# Patient Record
Sex: Male | Born: 2000
Health system: Southern US, Community
[De-identification: ages and names within clinical notes are randomized; demographics above are authoritative.]

## PROBLEM LIST (undated history)

## (undated) DIAGNOSIS — R278 Other lack of coordination: Secondary | ICD-10-CM

## (undated) DIAGNOSIS — F909 Attention-deficit hyperactivity disorder, unspecified type: Secondary | ICD-10-CM

## (undated) DIAGNOSIS — F902 Attention-deficit hyperactivity disorder, combined type: Secondary | ICD-10-CM

## (undated) HISTORY — DX: Other lack of coordination: R27.8

## (undated) HISTORY — DX: Attention-deficit hyperactivity disorder, combined type: F90.2

---
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
## 2018-02-15 ENCOUNTER — Other Ambulatory Visit: Payer: Self-pay

## 2018-02-15 MED ORDER — VYVANSE 70 MG PO CAPS: 70.0000 mg | ORAL_CAPSULE | Freq: Every day | ORAL | 0 refills | Status: DC

## 2018-02-15 NOTE — Telephone Encounter (Signed)
Mom called in for refill for Vyvanse . Last visit 12/06/2017 next visit 03/02/2018. [facility] [place] [place]

## 2018-02-15 NOTE — Telephone Encounter (Signed)
Vyvanse 70 mg daily, # 30 with no refills. RX for above e-scribed and sent to pharmacy on record  [id number] [place] [place] - [address] [place] [address] Phone: [phone] Fax: [phone]    

## 2018-03-02 ENCOUNTER — Institutional Professional Consult (permissible substitution): Payer: BLUE CROSS/BLUE SHIELD | Admitting: Pediatrics

## 2018-03-17 ENCOUNTER — Other Ambulatory Visit: Payer: Self-pay

## 2018-03-17 MED ORDER — VYVANSE 70 MG PO CAPS: 70.0000 mg | ORAL_CAPSULE | Freq: Every day | ORAL | 0 refills | Status: DC

## 2018-03-17 NOTE — Telephone Encounter (Signed)
RX for above e-scribed and sent to pharmacy on record  [id number] [place] [place] - [address] [place] [address] Phone: [phone] Fax: [phone]   

## 2018-03-17 NOTE — Telephone Encounter (Signed)
Mom called in for refill for Vyvanse 70mg . Last visit 12/06/2017 next visit 03/21/2018. [facility] [place]

## 2018-03-21 ENCOUNTER — Encounter: Payer: Self-pay | Admitting: Pediatrics

## 2018-03-21 ENCOUNTER — Ambulatory Visit (INDEPENDENT_AMBULATORY_CARE_PROVIDER_SITE_OTHER): Payer: BLUE CROSS/BLUE SHIELD | Admitting: Pediatrics

## 2018-03-21 VITALS — BP 129/74 | HR 105 | Ht 67.5 in | Wt 165.0 lb

## 2018-03-21 DIAGNOSIS — R278 Other lack of coordination: Secondary | ICD-10-CM | POA: Diagnosis not present

## 2018-03-21 DIAGNOSIS — F902 Attention-deficit hyperactivity disorder, combined type: Secondary | ICD-10-CM | POA: Diagnosis not present

## 2018-03-21 DIAGNOSIS — Z79899 Other long term (current) drug therapy: Secondary | ICD-10-CM

## 2018-03-21 DIAGNOSIS — Z719 Counseling, unspecified: Secondary | ICD-10-CM

## 2018-03-21 DIAGNOSIS — Z7189 Other specified counseling: Secondary | ICD-10-CM

## 2018-03-21 NOTE — Progress Notes (Signed)
[facility] [address] [place] [address] Dept: [phone] Dept Fax: [phone] Loc: [phone] Loc Fax: [phone]  Medical Follow-up  Patient ID: Bruce Barton, male  DOB: 09/25/2001, 17 y.o.  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 Last follow up March 2019 and medicated with Vyvanse 70 mg every morning.  Reports daily medication, even on weekends. 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  Friend told him about the job.   EDUCATION: School: Graduated HS - [employer] in June 2019  Working now for golf course - will take classes at [employer] (Lives in [place], [place]).  Will major in Restaurant manager, fast foodAnimal Care/Tech Was working at [employer] - was fired when his truck broke down.  Some issues with coworkers.   MEDICAL HISTORY: Appetite: WNL  Sleep: Bedtime: Averages 2130 due to works  Awakens: 0430 for work Needs to leave house by [employer]0515  Takes morning medication. Sleep Concerns: Initiation/Maintenance/Other: Asleep easily, sleeps through the night, feels well-rested.  No Sleep concerns. No concerns for toileting. Daily stool, no constipation or diarrhea. Void urine no difficulty. No enuresis.   Participate in daily oral hygiene to include brushing and flossing.  Driving is going well. Wears off around 7 pm.  Individual Medical History/Review of System Changes? No  Allergies: Amoxicillin er  Current Medications:   Vyvanse 70 mg daily Medication Side Effects: None  Family  Medical/Social History Changes?: No  MENTAL HEALTH: Mental Health Issues: Denies sadness, loneliness or depression. No self harm or thoughts of self harm or injury. Denies fears, worries and anxieties. Has good peer relations and is not a bully nor is victimized.  Review of Systems  Neurological: Negative for seizures, speech difficulty and headaches.  Psychiatric/Behavioral: Negative for behavioral problems, decreased concentration, dysphoric mood and sleep disturbance. The patient is not nervous/anxious and is not hyperactive.   All other systems reviewed and are negative.  PHYSICAL EXAM: Vitals:  Today's Vitals   03/21/18 1406  BP: 129/74  Pulse: (!) 105  Weight: 165 lb (74.8 kg)  Height: 5' 7.5" (1.715 m)  , 82 %ile (Z= 0.90) based on CDC (Boys, 2-20 Years) BMI-for-age based on BMI available as of 03/21/2018. Body mass index is 25.46 kg/m.  General Exam: Physical Exam  Constitutional: He is oriented to person, place, and time. Vital signs are normal. He appears well-developed and well-nourished. He is cooperative. No distress.  HENT:  Head: Normocephalic.  Right Ear: Tympanic membrane and ear canal normal.  Left Ear: Tympanic membrane and ear canal normal.  Nose: Nose normal.  Mouth/Throat: Uvula is midline, oropharynx is clear and moist and mucous membranes are normal.  Eyes: Pupils are equal, round, and reactive to light. Conjunctivae, EOM and lids are normal.  Neck: Normal range of motion. Neck supple. No thyromegaly present.  Cardiovascular: Normal rate, regular rhythm and intact distal pulses.  Pulmonary/Chest: Effort normal and breath sounds normal.  Abdominal: Soft. Normal appearance.  Genitourinary:  Genitourinary Comments: Deferred  Musculoskeletal: Normal range of motion.  Neurological: He is alert and oriented to person, place, and time.  He has normal strength and normal reflexes. He displays no tremor. No cranial nerve deficit or sensory deficit. He exhibits  normal muscle tone. He displays a negative Romberg sign. He displays no seizure activity. Coordination and gait normal.  Skin: Skin is warm, dry and intact. Lesion noted.  Scalp cyst Right shoulder new tattoo - eagle head and flag, large, well-healed  Psychiatric: He has a normal mood and affect. His speech is normal and behavior is normal. Judgment and thought content normal. His mood appears not anxious. His affect is not inappropriate. He is not agitated, not aggressive and not hyperactive. Cognition and memory are normal. He does not express impulsivity or inappropriate judgment. He expresses no suicidal ideation. He expresses no suicidal plans. He is attentive.  Vitals reviewed.  Neurological: oriented to place and person  Testing/Developmental Screens: CGI:15/10  Reviewed with patient     DIAGNOSES:    ICD-10-CM   1. Attention deficit hyperactivity disorder (ADHD), combined type F90.2   2. Dysgraphia R27.8   3. Medication management Z79.899   4. Patient counseled Z71.9   5. Counseling and coordination of care Z71.89     RECOMMENDATIONS:  Patient Instructions  DISCUSSION: Patient counseled regarding the following coordination of care items:  Continue medication as directed Vyvanse 70 mg every morning RX for above e-scribed and sent to pharmacy on record  [id number] [place] - [address] [place] [address] Phone: [phone] Fax: [phone]  Counseled medication administration, effects, and possible side effects.  ADHD medications discussed to include different medications and pharmacologic properties of each. Recommendation for specific medication to include dose, administration, expected effects, possible side effects and the risk to benefit ratio of medication management.  Advised importance of:  Good sleep hygiene (8- 10 hours per night) Limited screen time (none on school nights, no more than 2 hours on  weekends) Regular exercise(outside and active play) Healthy eating (drink water, no sodas/sweet tea, limit portions and no seconds).  Counseling at this visit included the review of old records and/or current chart with the patient and family.   Counseling included the following discussion points presented at every visit to improve understanding and treatment compliance.  Recent health history and today's examination Growth and development with anticipatory guidance provided regarding brain growth, executive function maturation and pubertal development  Additionally the patient was counseled to take medication while driving.  Smoking cessation   1.  Almost all adult tobacco users start when they are teens. 2.  While tobacco use rates have been declining among youth over the past 20 years, almost a quarter of teens still use tobacco. 3. Tobacco-using teens are more likely to use other substances, become involved in violent behavior, be sexually active, and are at a higher risk for depression and suicide. Local Resources: [place] Quitline SpiritualAlarm.tn 1-800-QUIT-NOW  [facility] Tobacco Prevention and Control Mary Gillett mgillet@co .[place].us [phone]    Online Resources: American Cancer Society  http://www.cancer.org  Teens Health  EarlyMetal.com.br  Centers for Disease Control and Prevention ParkingAffiliateProgram.tn     Patient verbalized understanding of all topics discussed.  NEXT APPOINTMENT: Return in about 3 months (around 06/21/2018) for Medical Follow up. Medical Decision-making: More than 50% of the appointment was spent counseling and discussing diagnosis and management of symptoms with the patient and family.  Leticia Penna, NP Counseling Time: 40 Total Contact Time: 50

## 2018-03-21 NOTE — Patient Instructions (Addendum)
DISCUSSION: Patient counseled regarding the following coordination of care items:  Continue medication as directed Vyvanse 70 mg every morning RX for above e-scribed and sent to pharmacy on record  [id number] [place] [place] - [address] [place] [address] Phone: [phone] Fax: [phone]  Counseled medication administration, effects, and possible side effects.  ADHD medications discussed to include different medications and pharmacologic properties of each. Recommendation for specific medication to include dose, administration, expected effects, possible side effects and the risk to benefit ratio of medication management.  Advised importance of:  Good sleep hygiene (8- 10 hours per night) Limited screen time (none on school nights, no more than 2 hours on weekends) Regular exercise(outside and active play) Healthy eating (drink water, no sodas/sweet tea, limit portions and no seconds).  Counseling at this visit included the review of old records and/or current chart with the patient and family.   Counseling included the following discussion points presented at every visit to improve understanding and treatment compliance.  Recent health history and today's examination Growth and development with anticipatory guidance provided regarding brain growth, executive function maturation and pubertal development  Additionally the patient was counseled to take medication while driving.  Smoking cessation   1.  Almost all adult tobacco users start when they are teens. 2.  While tobacco use rates have been declining among youth over the past 20 years, almost a quarter of teens still use tobacco. 3. Tobacco-using teens are more likely to use other substances, become involved in violent behavior, be sexually active, and are at a higher risk for depression and suicide. Local Resources: [place] Quitline SpiritualAlarm.tnhttp://www.quitlinenc.com/ 1-800-QUIT-NOW  [place] Tobacco Prevention and Control Mary Gillett mgillet@co .[email or web address] [phone]    Online Resources: American Cancer Society  http://www.cancer.org  Teens Health  EarlyMetal.com.brhttp://kidshealth.org/teen/drug_alcohol/tobacco/smoking.html  Centers for Disease Control and Prevention ReviewTheMovie.co.zahttp://www.cdc.gov/tobacco/basic_information/youth/index.htm

## 2018-04-18 ENCOUNTER — Other Ambulatory Visit: Payer: Self-pay

## 2018-04-18 MED ORDER — VYVANSE 70 MG PO CAPS: 70.0000 mg | ORAL_CAPSULE | Freq: Every day | ORAL | 0 refills | Status: DC

## 2018-04-18 NOTE — Telephone Encounter (Signed)
E-Prescribed Vyvanse 70 mg directly to  [id number] [place] [address] - [address] [place] [address] Phone: [phone] Fax: [phone]   

## 2018-04-18 NOTE — Telephone Encounter (Signed)
Mom called in for refill for Vyvanse 70mg. Last visit6/24/2019 next visit9/18/2019. [facility] [place] [place] 

## 2018-04-26 ENCOUNTER — Other Ambulatory Visit: Payer: Self-pay

## 2018-04-26 MED ORDER — LISDEXAMFETAMINE DIMESYLATE 30 MG PO CAPS: 30.0000 mg | ORAL_CAPSULE | Freq: Every evening | ORAL | 0 refills | Status: DC

## 2018-04-26 NOTE — Telephone Encounter (Signed)
Mom called in for refill for Vyvanse 30mg . Last visit 03/21/2018 next visit 06/15/2018. [facility] [place]

## 2018-04-26 NOTE — Telephone Encounter (Signed)
RX for above e-scribed and sent to pharmacy on record  [id number] [place] [place] - [address] [place] [address] Phone: [phone] Fax: [phone]   

## 2018-05-13 ENCOUNTER — Other Ambulatory Visit: Payer: Self-pay

## 2018-05-13 MED ORDER — VYVANSE 70 MG PO CAPS: 70.0000 mg | ORAL_CAPSULE | Freq: Every day | ORAL | 0 refills | Status: DC

## 2018-05-13 NOTE — Telephone Encounter (Signed)
Vyvanse 70 mg daily, # 30 with no refills. RX for above e-scribed and sent to pharmacy on record  [id number] [place] - [address] [place] [address] Phone: [phone] Fax: [phone]

## 2018-05-13 NOTE — Telephone Encounter (Signed)
Mom called in for refill for Vyvanse 70mg . Last visit6/24/2019 next visit9/18/2019. [facility] [place]

## 2018-06-15 ENCOUNTER — Encounter: Payer: Self-pay | Admitting: Pediatrics

## 2018-06-15 ENCOUNTER — Ambulatory Visit (INDEPENDENT_AMBULATORY_CARE_PROVIDER_SITE_OTHER): Payer: BC Managed Care – PPO | Admitting: Pediatrics

## 2018-06-15 VITALS — BP 117/85 | HR 103 | Ht 67.75 in | Wt 165.0 lb

## 2018-06-15 DIAGNOSIS — R278 Other lack of coordination: Secondary | ICD-10-CM

## 2018-06-15 DIAGNOSIS — F902 Attention-deficit hyperactivity disorder, combined type: Secondary | ICD-10-CM | POA: Diagnosis not present

## 2018-06-15 DIAGNOSIS — Z79899 Other long term (current) drug therapy: Secondary | ICD-10-CM | POA: Diagnosis not present

## 2018-06-15 DIAGNOSIS — Z719 Counseling, unspecified: Secondary | ICD-10-CM | POA: Diagnosis not present

## 2018-06-15 DIAGNOSIS — Z7189 Other specified counseling: Secondary | ICD-10-CM

## 2018-06-15 MED ORDER — VYVANSE 70 MG PO CAPS: 70.0000 mg | ORAL_CAPSULE | Freq: Every day | ORAL | 0 refills | Status: DC

## 2018-06-15 NOTE — Patient Instructions (Addendum)
DISCUSSION: Patient and family counseled regarding the following coordination of care items:  Continue medication as directed Vyvanse 70 mg every morning RX for above e-scribed and sent to pharmacy on record  [id number] [place] - [address] [place] [address] Phone: [phone] Fax: [phone]  Counseled medication administration, effects, and possible side effects.  ADHD medications discussed to include different medications and pharmacologic properties of each. Recommendation for specific medication to include dose, administration, expected effects, possible side effects and the risk to benefit ratio of medication management.  Advised importance of:  Good sleep hygiene (8- 10 hours per night) Limited screen time (none on school nights, no more than 2 hours on weekends) Regular exercise(outside and active play) Healthy eating (drink water, no sodas/sweet tea, limit portions and no seconds).  Counseling at this visit included the review of old records and/or current chart with the patient and family.   Counseling included the following discussion points presented at every visit to improve understanding and treatment compliance.  Recent health history and today's examination Growth and development with anticipatory guidance provided regarding brain growth, executive function maturation and pubertal development School progress and continued advocay for appropriate accommodations to include maintain Structure, routine, organization, reward, motivation and consequences.  Additionally the patient was counseled to take medication while driving.  Smoking cessation   1.  Almost all adult tobacco users start when they are teens. 2.  While tobacco use rates have been declining among youth over the past 20 years, almost a quarter of teens still use tobacco. 3. Tobacco-using teens are more likely to use other substances, become involved in violent  behavior, be sexually active, and are at a higher risk for depression and suicide. Local Resources: [place] Quitline SpiritualAlarm.tnhttp://www.quitlinenc.com/ 1-800-QUIT-NOW  [facility] [place] Tobacco Prevention and Control Mary Gillett mgillet@co .[place].us [phone]   Online Resources: American Cancer Society  http://www.cancer.org  Teens Health  EarlyMetal.com.brhttp://kidshealth.org/teen/drug_alcohol/tobacco/smoking.html  Centers for Disease Control and Prevention ReviewTheMovie.co.zahttp://www.cdc.gov/tobacco/basic_information/youth/index.htm
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
## 2018-07-14 ENCOUNTER — Other Ambulatory Visit: Payer: Self-pay

## 2018-07-14 MED ORDER — VYVANSE 70 MG PO CAPS: 70.0000 mg | ORAL_CAPSULE | Freq: Every day | ORAL | 0 refills | Status: DC

## 2018-07-14 NOTE — Telephone Encounter (Signed)
Mom called in for refill for Vyvanse70mg. Last visit9/18/2019 next visit12/12/2017. [facility] [place] [place] 

## 2018-07-14 NOTE — Telephone Encounter (Signed)
RX for above e-scribed and sent to pharmacy on record  [id number] [place] [place] - [address] [place] [address] Phone: [phone] Fax: [phone]   

## 2018-08-16 ENCOUNTER — Other Ambulatory Visit: Payer: Self-pay

## 2018-08-16 MED ORDER — VYVANSE 70 MG PO CAPS: 70.0000 mg | ORAL_CAPSULE | Freq: Every day | ORAL | 0 refills | Status: DC

## 2018-08-16 NOTE — Telephone Encounter (Signed)
RX for above e-scribed and sent to pharmacy on record  [id number] [place] - [address] [place] [address] Phone: [phone] Fax: [phone]   

## 2018-08-16 NOTE — Telephone Encounter (Signed)
Mom called in for refill for Vyvanse70mg . Last visit9/18/2019 next visit12/12/2017. [facility] [place]

## 2018-08-31 ENCOUNTER — Encounter: Payer: Self-pay | Admitting: Pediatrics

## 2018-08-31 ENCOUNTER — Ambulatory Visit (INDEPENDENT_AMBULATORY_CARE_PROVIDER_SITE_OTHER): Payer: BC Managed Care – PPO | Admitting: Pediatrics

## 2018-08-31 VITALS — BP 108/80 | HR 108 | Ht 67.5 in | Wt 172.0 lb

## 2018-08-31 DIAGNOSIS — F902 Attention-deficit hyperactivity disorder, combined type: Secondary | ICD-10-CM

## 2018-08-31 DIAGNOSIS — Z79899 Other long term (current) drug therapy: Secondary | ICD-10-CM

## 2018-08-31 DIAGNOSIS — Z719 Counseling, unspecified: Secondary | ICD-10-CM | POA: Diagnosis not present

## 2018-08-31 DIAGNOSIS — R278 Other lack of coordination: Secondary | ICD-10-CM | POA: Diagnosis not present

## 2018-08-31 DIAGNOSIS — Z7189 Other specified counseling: Secondary | ICD-10-CM

## 2018-08-31 MED ORDER — VYVANSE 70 MG PO CAPS: 70.0000 mg | ORAL_CAPSULE | Freq: Every day | ORAL | 0 refills | Status: DC

## 2018-08-31 NOTE — Patient Instructions (Addendum)
DISCUSSION: Patient and family counseled regarding the following coordination of care items:  Continue medication as directed Vyvanse 70 mg every morning RX for above e-scribed and sent to pharmacy on record  [id number] [place] - [address] [place] [address] Phone: [phone] Fax: [phone]  Counseled medication administration, effects, and possible side effects.  ADHD medications discussed to include different medications and pharmacologic properties of each. Recommendation for specific medication to include dose, administration, expected effects, possible side effects and the risk to benefit ratio of medication management.  Advised importance of:  Good sleep hygiene (8- 10 hours per night) Limited screen time (none on school nights, no more than 2 hours on weekends) Regular exercise(outside and active play) Healthy eating (drink water, no sodas/sweet tea, limit portions and no seconds).  Counseling at this visit included the review of old records and/or current chart with the patient and family.   Counseling included the following discussion points presented at every visit to improve understanding and treatment compliance.  Recent health history and today's examination Growth and development with anticipatory guidance provided regarding brain growth, executive function maturation and pubertal development School progress and continued advocay for appropriate accommodations to include maintain Structure, routine, organization, reward, motivation and consequences.  Follow up with dermatology for scalp cyst.

## 2018-08-31 NOTE — Progress Notes (Signed)
[facility] [address] [place] [address] Dept: [phone] Dept Fax: [phone] Loc: [phone] Loc Fax: [phone]  Medical Follow-up  Patient ID: Bruce Barton, male  DOB: 08/13/2001, 17 y.o.  MRN: [id number]  Date of Evaluation: 08/31/18  PCP: Bernadette HoitPuzio, [name], MD  Accompanied by: Self Patient Lives with: mother and father  TJ is home, working [employer]  HISTORY/CURRENT STATUS:  Human resources officerChief Complaint - Polite and cooperative and present for medical follow up for medication management of ADHD, dysgraphia and learning differences. Last follow up 06/15/18.  Currently prescribed Vyvanse 70 mg every morning and reports good daily compliance.   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 Has had job interviews for [employer] (one today and has a few end of week).    No classes yet, may register in January, depends on jobs.  MEDICAL HISTORY: Appetite: WNL  Sleep: Bedtime: 2230 varibable Awakens: 0800 Sleep Concerns: Initiation/Maintenance/Other: Asleep easily, sleeps through the night, feels well-rested.  No Sleep concerns.  No concerns for toileting. Daily stool, no constipation or diarrhea. Void urine no difficulty. No enuresis.   Participate in daily oral hygiene to include brushing and flossing.  Individual Medical History/Review of System Changes? No  Allergies: Amoxicillin er  Current Medications:  Vyvanse 70 mg every morning Medication Side Effects: None  Family Medical/Social History Changes?: No  MENTAL HEALTH: Mental Health Issues:  Denies sadness, loneliness or depression. No self harm or thoughts of self harm or injury. Denies fears, worries and anxieties. Has good peer relations and is not a bully nor is victimized.  Review of Systems  Neurological: Negative for seizures,  speech difficulty and headaches.  Psychiatric/Behavioral: Negative for behavioral problems, decreased concentration, dysphoric mood and sleep disturbance. The patient is not nervous/anxious and is not hyperactive.   All other systems reviewed and are negative.  PHYSICAL EXAM: Vitals:  Today's Vitals   08/31/18 1447  Weight: 172 lb (78 kg)  Height: 5' 7.5" (1.715 m)  , 86 %ile (Z= 1.07) based on CDC (Boys, 2-20 Years) BMI-for-age based on BMI available as of 08/31/2018. Body mass index is 26.54 kg/m.  General Exam: Physical Exam  Constitutional: He is oriented to person, place, and time. Vital signs are normal. He appears well-developed and well-nourished. He is cooperative. No distress.  HENT:  Head: Normocephalic.  Right Ear: Tympanic membrane and ear canal normal.  Left Ear: Tympanic membrane and ear canal normal.  Nose: Nose normal.  Mouth/Throat: Uvula is midline, oropharynx is clear and moist and mucous membranes are normal.  Eyes: Pupils are equal, round, and reactive to light. Conjunctivae, EOM and lids are normal.  Neck: Normal range of motion. Neck supple. No thyromegaly present.  Cardiovascular: Normal rate, regular rhythm and intact distal pulses.  Pulmonary/Chest: Effort normal and breath sounds normal.  Abdominal: Soft. Normal appearance.  Genitourinary:  Genitourinary Comments: Deferred  Musculoskeletal: Normal range of motion.  Neurological: He is alert and oriented to person, place, and time. He has normal strength and normal reflexes. He displays no tremor. No cranial nerve deficit or sensory deficit. He exhibits normal muscle tone. He displays a negative Romberg sign. He displays no seizure activity. Coordination and gait normal.  Skin: Skin is warm, dry and intact. Lesion noted.  Scalp cyst Right shoulder new tattoo - eagle head and flag, large, well-healed  Psychiatric: He has a  normal mood and affect. His speech is normal and behavior is normal. Judgment and  thought content normal. His mood appears not anxious. His affect is not inappropriate. He is not agitated, not aggressive and not hyperactive. Cognition and memory are normal. He does not express impulsivity or inappropriate judgment. He expresses no suicidal ideation. He expresses no suicidal plans. He is attentive.  Vitals reviewed.  Neurological: oriented to place and person Testing/Developmental Screens: CGI:12/15     DIAGNOSES:    ICD-10-CM   1. Attention deficit hyperactivity disorder (ADHD), combined type F90.2   2. Dysgraphia R27.8   3. Medication management Z79.899   4. Patient counseled Z71.9   5. Parenting dynamics counseling Z71.89   6. Counseling and coordination of care Z71.89     RECOMMENDATIONS:  Patient Instructions  DISCUSSION: Patient and family counseled regarding the following coordination of care items:  Continue medication as directed Vyvanse 70 mg every morning RX for above e-scribed and sent to pharmacy on record  [id number] [place] - [address] [place] [address] Phone: [phone] Fax: [phone]      Counseled medication administration, effects, and possible side effects.  ADHD medications discussed to include different medications and pharmacologic properties of each. Recommendation for specific medication to include dose, administration, expected effects, possible side effects and the risk to benefit ratio of medication management.  Advised importance of:  Good sleep hygiene (8- 10 hours per night) Limited screen time (none on school nights, no more than 2 hours on weekends) Regular exercise(outside and active play) Healthy eating (drink water, no sodas/sweet tea, limit portions and no seconds).  Counseling at this visit included the review of old records and/or current chart with the patient and family.   Counseling included the following discussion points presented at every visit to improve  understanding and treatment compliance.  Recent health history and today's examination Growth and development with anticipatory guidance provided regarding brain growth, executive function maturation and pubertal development School progress and continued advocay for appropriate accommodations to include maintain Structure, routine, organization, reward, motivation and consequences.  Patient verbalized understanding of all topics discussed.   NEXT APPOINTMENT: Return in about 3 months (around 11/30/2018) for Medical Follow up.   Medical Decision-making: More than 50% of the appointment was spent counseling and discussing diagnosis and management of symptoms with the patient and family.  Leticia Penna, NP Counseling Time: 40 Total Contact Time: 50

## 2018-09-14 ENCOUNTER — Other Ambulatory Visit: Payer: Self-pay

## 2018-09-14 MED ORDER — VYVANSE 70 MG PO CAPS: 70.0000 mg | ORAL_CAPSULE | Freq: Every day | ORAL | 0 refills | Status: DC

## 2018-09-14 NOTE — Telephone Encounter (Signed)
Rx for Vyvanse 70mg  mg sent to pt pharmacy:  [facility] [id number] [place] - [address] [place] [address] Phone: [phone] Fax: [phone]

## 2018-09-14 NOTE — Telephone Encounter (Signed)
Mom called in for refill for Vyvanse70mg . Last visit12/12/2017 next visit3/11/2018. [facility] [place]

## 2018-10-20 ENCOUNTER — Other Ambulatory Visit: Payer: Self-pay

## 2018-10-20 MED ORDER — VYVANSE 70 MG PO CAPS: 70.0000 mg | ORAL_CAPSULE | Freq: Every day | ORAL | 0 refills | Status: DC

## 2018-10-20 MED ORDER — LISDEXAMFETAMINE DIMESYLATE 30 MG PO CAPS: 30.0000 mg | ORAL_CAPSULE | Freq: Every day | ORAL | 0 refills | Status: DC

## 2018-10-20 NOTE — Telephone Encounter (Signed)
RX for above e-scribed and sent to pharmacy on record  [id number] [place] [place] - [address] [place] [address] Phone: [phone] Fax: [phone]   

## 2018-10-20 NOTE — Telephone Encounter (Signed)
Mom called in for refill for Vyvanse70mg and 30mg. Last visit12/12/2017 next visit3/11/2018. [facility] [place] [place] 

## 2018-11-24 ENCOUNTER — Other Ambulatory Visit: Payer: Self-pay

## 2018-11-24 MED ORDER — VYVANSE 70 MG PO CAPS: 70.0000 mg | ORAL_CAPSULE | Freq: Every day | ORAL | 0 refills | Status: DC

## 2018-11-24 MED ORDER — LISDEXAMFETAMINE DIMESYLATE 30 MG PO CAPS: 30.0000 mg | ORAL_CAPSULE | Freq: Every day | ORAL | 0 refills | Status: DC

## 2018-11-24 NOTE — Telephone Encounter (Signed)
RX for above e-scribed and sent to pharmacy on record  [id number] [place] [place] - [address] [place] [address] Phone: [phone] Fax: [phone]   

## 2018-11-24 NOTE — Telephone Encounter (Signed)
Mom called in for refill for Vyvanse70mg  and 30mg . Last visit12/12/2017 next visit3/11/2018. [facility] [place] [place]

## 2018-11-29 ENCOUNTER — Ambulatory Visit (INDEPENDENT_AMBULATORY_CARE_PROVIDER_SITE_OTHER): Payer: BC Managed Care – PPO | Admitting: Pediatrics

## 2018-11-29 ENCOUNTER — Encounter: Payer: Self-pay | Admitting: Pediatrics

## 2018-11-29 VITALS — BP 120/85 | HR 108 | Ht 68.0 in | Wt 179.0 lb

## 2018-11-29 DIAGNOSIS — Z7189 Other specified counseling: Secondary | ICD-10-CM

## 2018-11-29 DIAGNOSIS — Z79899 Other long term (current) drug therapy: Secondary | ICD-10-CM | POA: Diagnosis not present

## 2018-11-29 DIAGNOSIS — F902 Attention-deficit hyperactivity disorder, combined type: Secondary | ICD-10-CM

## 2018-11-29 DIAGNOSIS — R278 Other lack of coordination: Secondary | ICD-10-CM | POA: Diagnosis not present

## 2018-11-29 DIAGNOSIS — Z719 Counseling, unspecified: Secondary | ICD-10-CM

## 2018-11-29 NOTE — Progress Notes (Signed)
Patient ID: Bruce Barton, male   DOB: [birth year], 18 y.o.   MRN: [id number]  Medication Check  Patient ID: Bruce Barton  DOB: [birth year]  MRN: [id number]  DATE:11/29/18 Bruce Hoit, MD  Accompanied by: Self Patient Lives with: mother and father  Brother lives with GF out of home  HISTORY/CURRENT STATUS: Chief Complaint - Polite and cooperative and present for medical follow up for medication management of ADHD, dysgraphia and learning differences.  Last follow up 08/31/2018 and currently medicated with Vyvanse 70 mg every morning.  Has RX for Vyvanse 30 mg and uses for break or back up when out of 70 mg.  Good daily compliance.  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  Not doing his best.  All work is on-line, but class is live. Not able to Strategizing, needs to get a C in classes. Reports that he has spoken to professors, has not signed up for Disability services.   May be switching to college for history, and associates and then four year school wants Glass blower/designer.  Has classes Monday - Wednesday Am Thursday Am  MEDICAL HISTORY: Appetite: WNL   Sleep: Bedtime: 2200  Awakens: 0700 and variable for work/school   Concerns: Initiation/Maintenance/Other: Asleep easily, sleeps through the night, feels well-rested.  No Sleep concerns. No concerns for toileting. Daily stool, no constipation or diarrhea. Void urine no difficulty. No enuresis.   Participate in daily oral hygiene to include brushing and flossing.  Individual Medical History/ Review of Systems: Changes? :No  Family Medical/ Social History: Changes? No  Current Medications:  Vyvanse 70 mg every morning Medication Side Effects:  None  MENTAL HEALTH: Mental Health Issues:  Denies sadness, loneliness or depression. No self harm or thoughts of self harm or injury. Denies fears, worries and anxieties. Has good peer relations and is not a bully nor is victimized.  Review of Systems  Neurological: Negative for seizures, speech difficulty and headaches.  Psychiatric/Behavioral: Negative for behavioral problems, decreased concentration, dysphoric mood and sleep disturbance. The patient is not nervous/anxious and is not hyperactive.   All other systems reviewed and are negative.  PHYSICAL EXAM; Vitals:   11/29/18 1413  BP: 120/85  Pulse: (!) 108  Weight: 179 lb (81.2 kg)  Height: 5\' 8"  (1.727 m)   Body mass index is 27.22 kg/m.  General Physical Exam: Unchanged from previous exam, date:11/29/2018   Testing/Developmental Screens: CGI/ASRS = 13/12 Reviewed with patient     DIAGNOSES:    ICD-10-CM   1. Attention deficit hyperactivity disorder (ADHD), combined type F90.2   2. Dysgraphia R27.8   3. Medication management Z79.899   4. Patient counseled Z71.9   5. Counseling and coordination of care Z71.89     RECOMMENDATIONS:  Patient Instructions  DISCUSSION: Counseled regarding the following coordination of care items:  Continue medication as directed Vyvanse 70 mg every morning Vyvanse 30 mg for weekend and break as needed No Rx today last sent 11/24/2018  Counseled medication administration, effects, and possible side effects.  ADHD medications discussed to include different medications and pharmacologic properties of each. Recommendation for specific medication to include dose, administration, expected effects, possible side effects and the risk to benefit ratio of medication management.  Advised importance of:  Good sleep hygiene (8- 10 hours per night) Limited screen time (none on school nights, no more than 2 hours on weekends) Regular exercise(outside and active play) Healthy eating (drink  water, no sodas/sweet tea)  Counseling at this visit included the review of old records and/or current chart.   Counseling included the following discussion points presented at every visit to improve understanding and treatment compliance.  Recent health history and today's examination Growth and development with anticipatory guidance provided regarding brain growth, executive function maturation and pre or pubertal development. School progress and continued advocay for appropriate accommodations to include maintain Structure, routine, organization, reward, motivation and consequences.  Additionally the patient was counseled to take medication while driving.  Counseled to access disability services at college. https://www.[facility].org/  Emailed to patient.   Patient verbalized understanding of all topics discussed.  NEXT APPOINTMENT:  Return in about 3 months (around 03/01/2019) for Medical Follow up.  Medical Decision-making: More than 50% of the appointment was spent counseling and discussing diagnosis and management of symptoms with the patient and family.  Counseling Time: 25 minutes Total Contact Time: 30 minutes

## 2018-11-29 NOTE — Patient Instructions (Addendum)
DISCUSSION: Counseled regarding the following coordination of care items:  Continue medication as directed Vyvanse 70 mg every morning Vyvanse 30 mg for weekend and break as needed No Rx today last sent 11/24/2018  Counseled medication administration, effects, and possible side effects.  ADHD medications discussed to include different medications and pharmacologic properties of each. Recommendation for specific medication to include dose, administration, expected effects, possible side effects and the risk to benefit ratio of medication management.  Advised importance of:  Good sleep hygiene (8- 10 hours per night) Limited screen time (none on school nights, no more than 2 hours on weekends) Regular exercise(outside and active play) Healthy eating (drink water, no sodas/sweet tea)  Counseling at this visit included the review of old records and/or current chart.   Counseling included the following discussion points presented at every visit to improve understanding and treatment compliance.  Recent health history and today's examination Growth and development with anticipatory guidance provided regarding brain growth, executive function maturation and pre or pubertal development. School progress and continued advocay for appropriate accommodations to include maintain Structure, routine, organization, reward, motivation and consequences.  Additionally the patient was counseled to take medication while driving.  Counseled to access disability services at college. https://www.[facility].org/  Emailed to patient.

## 2018-12-22 ENCOUNTER — Other Ambulatory Visit: Payer: Self-pay

## 2018-12-22 MED ORDER — VYVANSE 70 MG PO CAPS: 70.0000 mg | ORAL_CAPSULE | Freq: Every day | ORAL | 0 refills | Status: DC

## 2018-12-22 MED ORDER — LISDEXAMFETAMINE DIMESYLATE 30 MG PO CAPS: 30.0000 mg | ORAL_CAPSULE | Freq: Every day | ORAL | 0 refills | Status: DC

## 2018-12-22 NOTE — Telephone Encounter (Signed)
E-Prescribed Vyvanse 70 mg and 30 mg directly to  [id number], [place] - [address] [place] [address] Phone: [phone] Fax: [phone]

## 2018-12-22 NOTE — Telephone Encounter (Signed)
Mom called in for refill for Vyvanse70mg and 30mg. Last visit12/12/2017 next visit3/11/2018. [facility] [place] [place] 

## 2019-01-06 IMAGING — US US SOFT TISSUE HEAD/NECK
1 series · 14 of 15 positions shown · non-contrast
Comparison: None available

CLINICAL DATA: Left parietal occipital palpable scalp area, mildly
tender

EXAM:
ULTRASOUND OF HEAD/NECK SOFT TISSUES
TECHNIQUE: Ultrasound examination of the head and neck soft tissues was
performed in the area of clinical concern.

[Series 1: us soft tissue head/neck · 0.03mm/px · 14 of 15 slices shown]
[im 1/15]
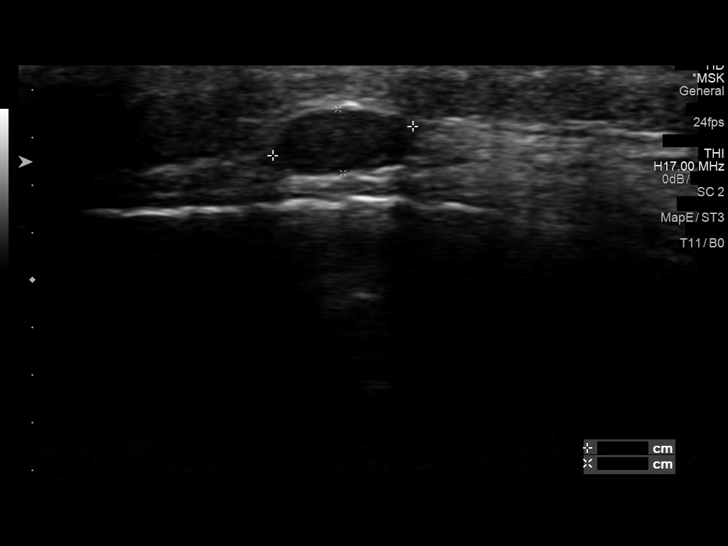
[im 2/15]
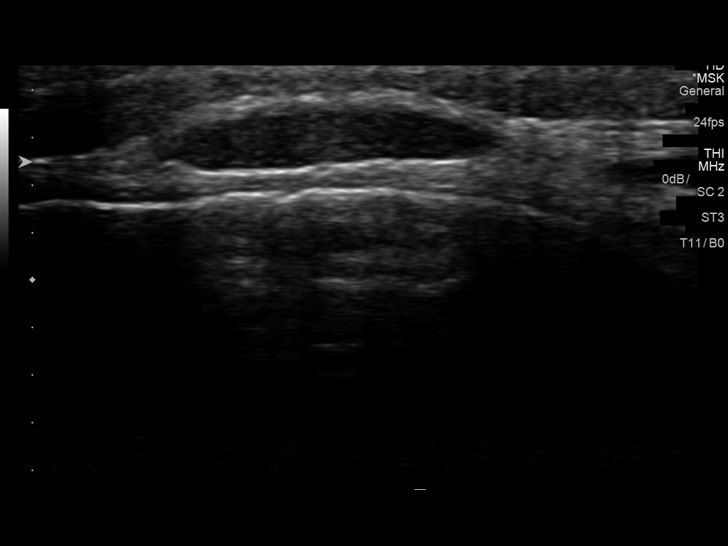
[im 3/15]
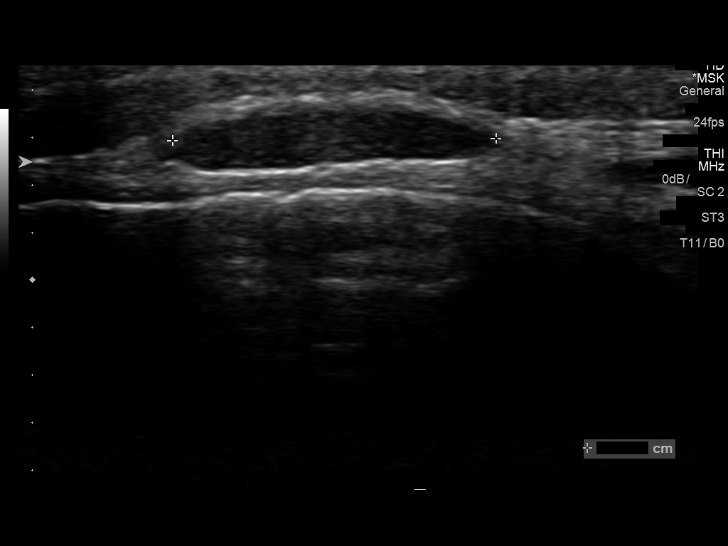
[im 4/15]
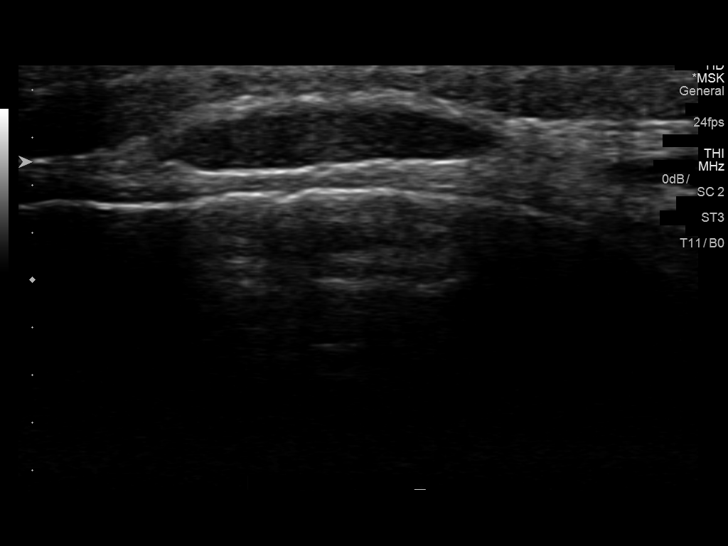
[im 5/15]
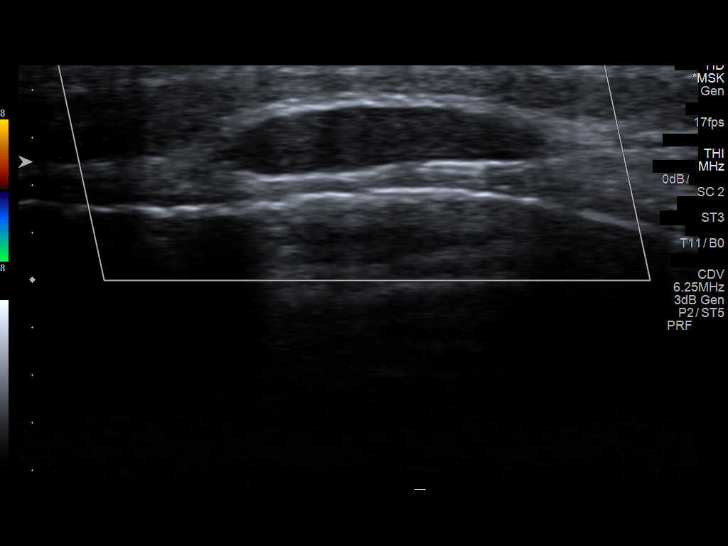
[im 6/15]
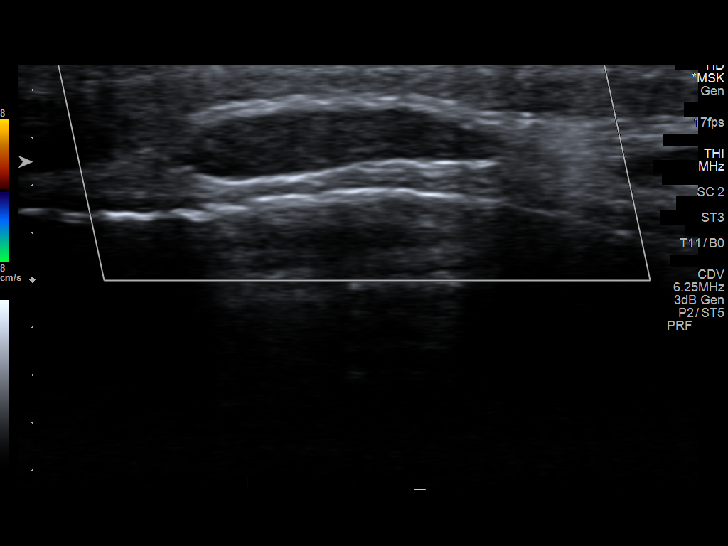
[im 7/15]
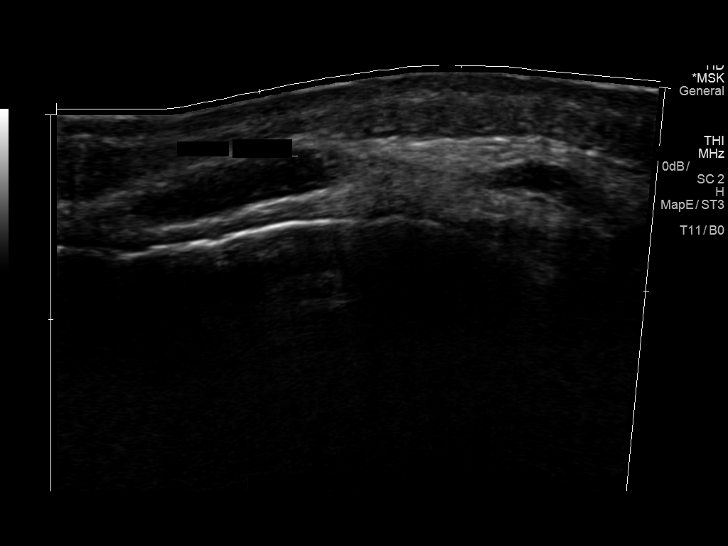
[im 9/15]
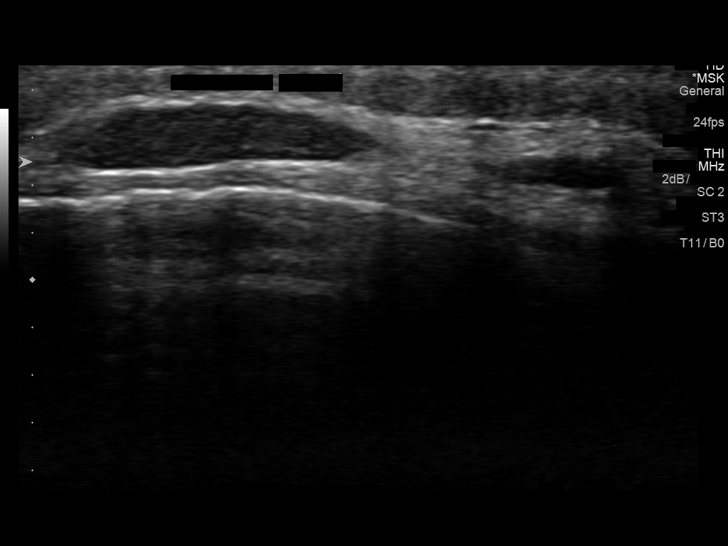
[im 10/15]
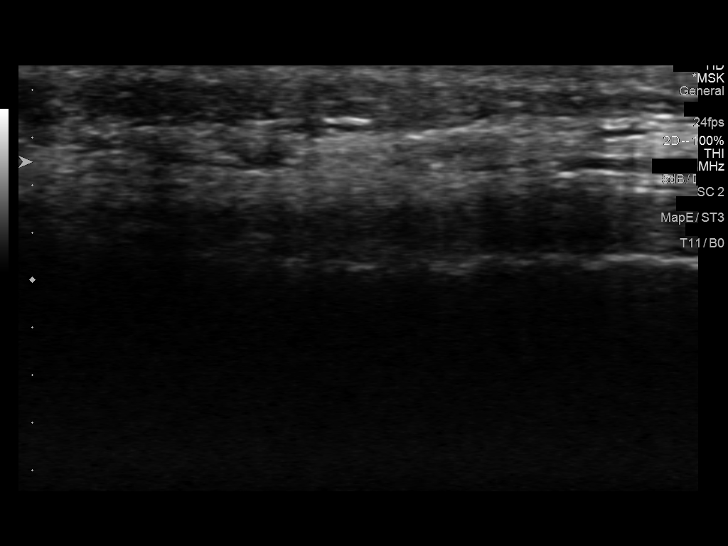
[im 11/15]
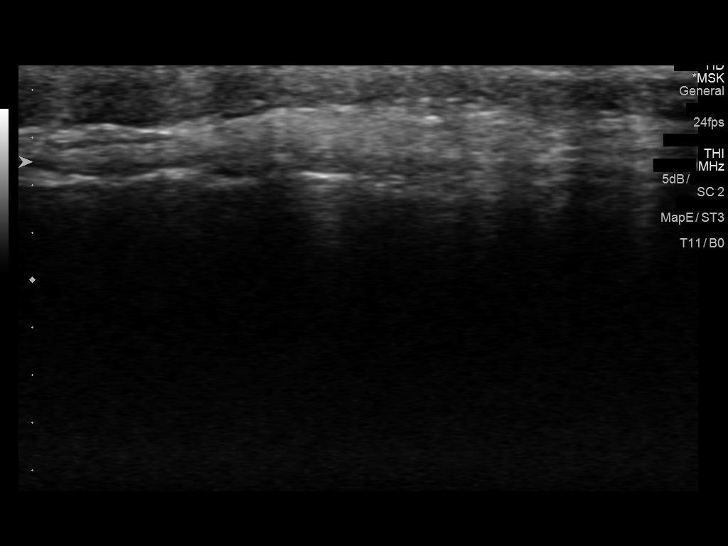
[im 12/15]
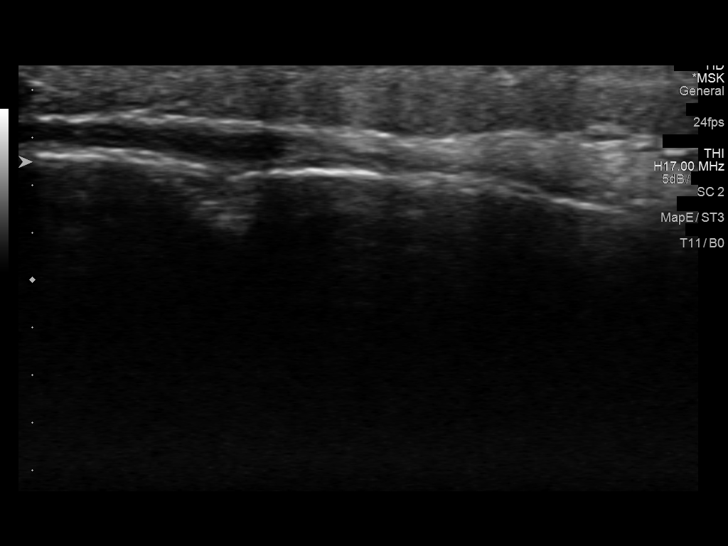
[im 13/15]
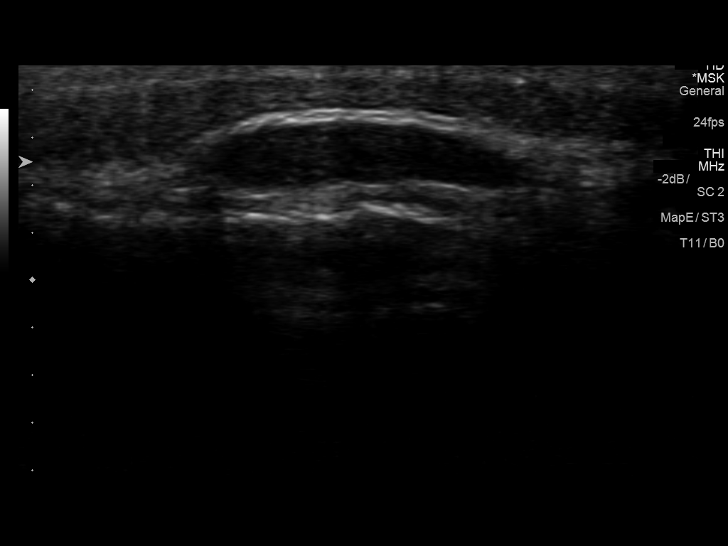
[im 14/15]
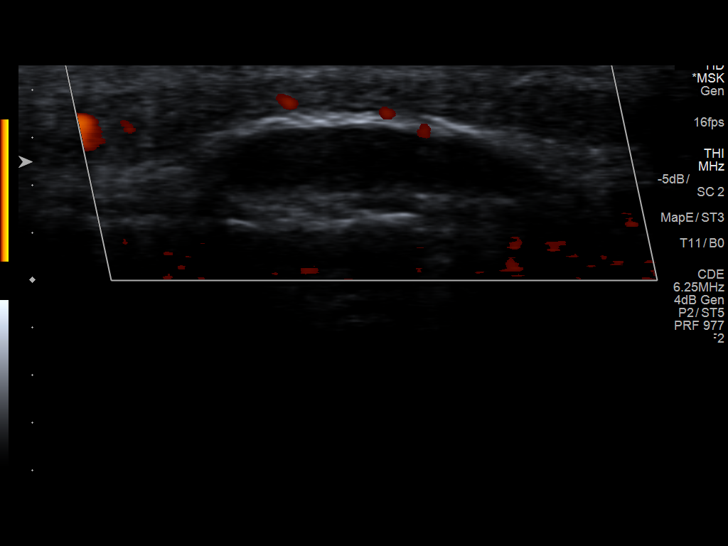
[im 15/15]
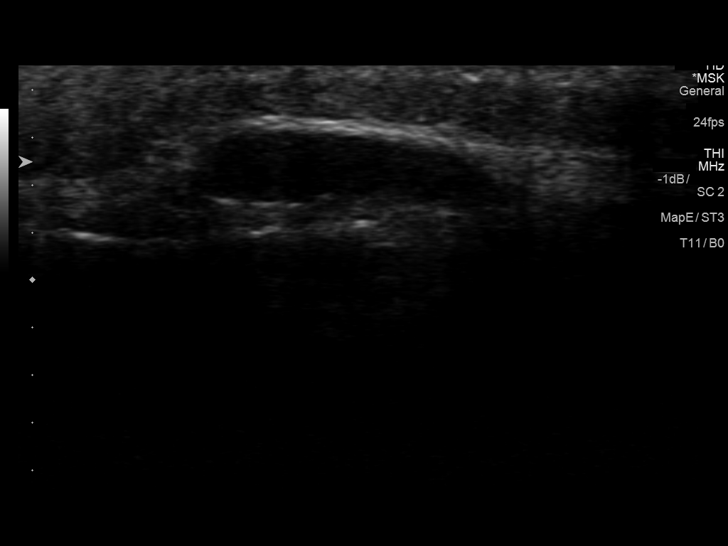

[14 of 15 positions shown; findings below may reference images not displayed]

FINDINGS: Limited superficial ultrasound performed of the left
parieto-occipital scalp area.

Palpable abnormality correlates with a superficial subcutaneous oval
hypoechoic abnormality measuring 1.4 cm in length and 0.6 cm in
short axis. No associated vascularity. No defined vascular fatty
hilum. This is favored represent a superficial cystic abnormality
such as a sebaceous cyst rather than a lymph node.
IMPRESSION: Left parietal occipital scalp abnormality correlates with a 1.4 x
0.6 cm superficial subcutaneous abnormality as described. This is
favored to represent a small sebaceous cyst over a lymph node.

## 2019-01-27 ENCOUNTER — Other Ambulatory Visit: Payer: Self-pay

## 2019-01-27 MED ORDER — VYVANSE 70 MG PO CAPS: 70.0000 mg | ORAL_CAPSULE | Freq: Every day | ORAL | 0 refills | Status: DC

## 2019-01-27 MED ORDER — LISDEXAMFETAMINE DIMESYLATE 30 MG PO CAPS: 30.0000 mg | ORAL_CAPSULE | Freq: Every day | ORAL | 0 refills | Status: DC

## 2019-01-27 NOTE — Telephone Encounter (Signed)
Mom called in for refill for Vyvanse70mg and 30mg . Last visit3/11/2018. [facility] [place] [place]

## 2019-01-27 NOTE — Telephone Encounter (Signed)
RX for above e-scribed and sent to pharmacy on record  [id number] [place], [place] - [address] [place] [address] Phone: [phone] Fax: [phone]   

## 2019-03-06 ENCOUNTER — Ambulatory Visit (INDEPENDENT_AMBULATORY_CARE_PROVIDER_SITE_OTHER): Payer: BC Managed Care – PPO | Admitting: Pediatrics

## 2019-03-06 ENCOUNTER — Encounter: Payer: Self-pay | Admitting: Pediatrics

## 2019-03-06 ENCOUNTER — Other Ambulatory Visit: Payer: Self-pay

## 2019-03-06 DIAGNOSIS — Z719 Counseling, unspecified: Secondary | ICD-10-CM | POA: Diagnosis not present

## 2019-03-06 DIAGNOSIS — Z79899 Other long term (current) drug therapy: Secondary | ICD-10-CM

## 2019-03-06 DIAGNOSIS — F902 Attention-deficit hyperactivity disorder, combined type: Secondary | ICD-10-CM | POA: Diagnosis not present

## 2019-03-06 DIAGNOSIS — R278 Other lack of coordination: Secondary | ICD-10-CM

## 2019-03-06 DIAGNOSIS — Z7189 Other specified counseling: Secondary | ICD-10-CM

## 2019-03-06 MED ORDER — LISDEXAMFETAMINE DIMESYLATE 30 MG PO CAPS: 30.0000 mg | ORAL_CAPSULE | Freq: Every day | ORAL | 0 refills | Status: DC

## 2019-03-06 MED ORDER — VYVANSE 70 MG PO CAPS: 70.0000 mg | ORAL_CAPSULE | Freq: Every day | ORAL | 0 refills | Status: DC

## 2019-03-06 NOTE — Patient Instructions (Signed)
DISCUSSION: Counseled regarding the following coordination of care items:  Continue medication as directed Vyvanse 70 mg one every morning Vyvanse 30 mg one as needed  RX for above e-scribed and sent to pharmacy on record  [id number] [place], [place] [address] Phone: [phone] Fax: [phone]  Counseled medication administration, effects, and possible side effects.  ADHD medications discussed to include different medications and pharmacologic properties of each. Recommendation for specific medication to include dose, administration, expected effects, possible side effects and the risk to benefit ratio of medication management.  Advised importance of:  Good sleep hygiene (8- 10 hours per night) maintain good routines Limited screen time (none on school nights, no more than 2 hours on weekends) Regular exercise(outside and active play) Healthy eating (drink water, no sodas/sweet tea)

## 2019-03-06 NOTE — Progress Notes (Signed)
[facility] [address] [place] [address] Dept: [phone] Dept Fax: [phone]  Medication Check by FaceTime due to COVID-19  Patient ID:  Bruce Barton  male DOB: 11/08/2000   18 y.o.   MRN: [id number]   DATE:03/06/19  PCP: Bernadette HoitPuzio, [name], MD  Interviewed: Bruce LentoAdam C Offer and Mother  Name: Ledon Snareracy Harding Location: Their home Provider location: providers private residence no others present  Virtual Visit via Video Note Connected with Bruce Lentodam C Linarez on 03/06/19 at  2:00 PM EDT by video enabled telemedicine application and verified that I am speaking with the correct person using two identifiers.   I discussed the limitations, risks, security and privacy concerns of performing an evaluation and management service by telephone and the availability of in person appointments. I also discussed with the parents that there may be a patient responsible charge related to this service. The parents expressed understanding and agreed to proceed.  HISTORY OF PRESENT ILLNESS/CURRENT STATUS: Bruce Barton is being followed for medication management for ADHD, dysgraphia and learning differences.   Last visit on 12/26/2018  Bruce Barton currently prescribed Vyvanse 70 mg and Vyvanse 30 mg daily (mostly taking the 70 mg and using the 30 mg when run out)   Takes medication at 0900 am. Eating well (eating breakfast, lunch and dinner).   Sleeping: bedtime 2200 pm and wakes at 0830  sleeping through the night.   EDUCATION: Bruce Barton is currently out of school for social distancing due to COVID-19.  Activities/ Exercise: daily outside time, likes to fish, yard work, swimming  Screen time: (phone, tablet, TV, computer): not excessive Has interview today at 6 pm, [employer], had job cut so working with Dad around th house.  Not taking classes this semester.  Not focused on school right now, wants to work some  first.  MEDICAL HISTORY: Individual Medical History/ Review of Systems: Changes? :No  Family Medical/ Social History: Changes? No   Patient Lives with: mother and father  Current Medications:  Vyvanse 70 mg every morning Vyvanse 30 mg as needed  Medication Side Effects: None  MENTAL HEALTH: Mental Health Issues:    Denies sadness, loneliness or depression. No self harm or thoughts of self harm or injury. Denies fears, worries and anxieties. Has good peer relations and is not a bully nor is victimized.  DIAGNOSES:    ICD-10-CM   1. Attention deficit hyperactivity disorder (ADHD), combined type F90.2   2. Dysgraphia R27.8   3. Medication management Z79.899   4. Patient counseled Z71.9   5. Parenting dynamics counseling Z71.89   6. Counseling and coordination of care Z71.89      RECOMMENDATIONS:  Patient Instructions  DISCUSSION: Counseled regarding the following coordination of care items:  Continue medication as directed Vyvanse 70 mg one every morning Vyvanse 30 mg one as needed  RX for above e-scribed and sent to pharmacy on record  [id number] [place], [place] - [address] [place] [address] Phone: [phone] Fax: [phone]  Counseled medication administration, effects, and possible side effects.  ADHD medications discussed to include different medications and pharmacologic properties of each. Recommendation for specific medication to include dose, administration, expected effects, possible side effects and the risk to benefit ratio of medication management.  Advised importance of:  Good sleep hygiene (8- 10 hours per night) maintain good routines Limited screen time (none on school nights, no more than 2 hours on  weekends) Regular exercise(outside and active play) Healthy eating (drink water, no sodas/sweet tea)     Discussed continued need for routine, structure, motivation, reward and positive reinforcement   Encouraged recommended limitations on TV, tablets, phones, video games and computers for non-educational activities.  Encouraged physical activity and outdoor play, maintaining social distancing.  Discussed how to talk to anxious children about coronavirus.   Referred to [employer]itud[employer] for resources about engaging children who are at home in home and online study.    NEXT APPOINTMENT:  Return in about 3 months (around 06/06/2019) for Medication Check. Please call the office for a sooner appointment if problems arise.  Medical Decision-making: More than 50% of the appointment was spent counseling and discussing diagnosis and management of symptoms with the patient and family.  I discussed the assessment and treatment plan with the parent. The parent was provided an opportunity to ask questions and all were answered. The parent agreed with the plan and demonstrated an understanding of the instructions.   The parent was advised to call back or seek an in-person evaluation if the symptoms worsen or if the condition fails to improve as anticipated.  I provided 25 minutes of non-face-to-face time during this encounter.   Completed record review for 0 minutes prior to the virtual video visit.   Len Childs, NP  Counseling Time: 25 minutes   Total Contact Time: 25 minutes

## 2019-04-07 ENCOUNTER — Other Ambulatory Visit: Payer: Self-pay

## 2019-04-07 MED ORDER — VYVANSE 70 MG PO CAPS: 70.0000 mg | ORAL_CAPSULE | Freq: Every day | ORAL | 0 refills | Status: DC

## 2019-04-07 MED ORDER — LISDEXAMFETAMINE DIMESYLATE 30 MG PO CAPS: 30.0000 mg | ORAL_CAPSULE | Freq: Every day | ORAL | 0 refills | Status: DC

## 2019-04-07 NOTE — Telephone Encounter (Signed)
Mom called in for refill for Vyvanse70mgand 30mg. Last visit6/04/2019. [facility] [place] [place] 

## 2019-04-07 NOTE — Telephone Encounter (Signed)
RX for above e-scribed and sent to pharmacy on record  [id number] [place], [place] - [address] [place] [address] Phone: [phone] Fax: [phone]   

## 2019-05-04 ENCOUNTER — Other Ambulatory Visit: Payer: Self-pay

## 2019-05-04 MED ORDER — VYVANSE 70 MG PO CAPS: 70.0000 mg | ORAL_CAPSULE | Freq: Every day | ORAL | 0 refills | Status: DC

## 2019-05-04 MED ORDER — LISDEXAMFETAMINE DIMESYLATE 30 MG PO CAPS: 30.0000 mg | ORAL_CAPSULE | Freq: Every day | ORAL | 0 refills | Status: DC

## 2019-05-04 NOTE — Telephone Encounter (Signed)
Mom called in for refill for Vyvanse70mg and 30mg . Last visit6/04/2019. [facility] [place], [place]

## 2019-05-04 NOTE — Telephone Encounter (Signed)
RX for above e-scribed and sent to pharmacy on record  [id number] [place], [place] - [address] [place] [address] Phone: [phone] Fax: [phone]   

## 2019-06-30 ENCOUNTER — Other Ambulatory Visit: Payer: Self-pay

## 2019-06-30 MED ORDER — VYVANSE 70 MG PO CAPS: 70.0000 mg | ORAL_CAPSULE | Freq: Every day | ORAL | 0 refills | Status: DC

## 2019-06-30 NOTE — Telephone Encounter (Signed)
Mom called in for refill for Vyvanse70mg . Last visit6/04/2019 next visit 07/04/2019. [facility] [place], [place]

## 2019-06-30 NOTE — Telephone Encounter (Signed)
Vyvanse 70 mg daily, # 30 with no RF's.RX for above e-scribed and sent to pharmacy on record  [id number] [place], [place] - [address] [place] [address] Phone: [phone] Fax: [phone]   

## 2019-07-04 ENCOUNTER — Encounter: Payer: Self-pay | Admitting: Pediatrics

## 2019-07-04 ENCOUNTER — Other Ambulatory Visit: Payer: Self-pay

## 2019-07-04 ENCOUNTER — Ambulatory Visit (INDEPENDENT_AMBULATORY_CARE_PROVIDER_SITE_OTHER): Payer: BC Managed Care – PPO | Admitting: Pediatrics

## 2019-07-04 DIAGNOSIS — Z719 Counseling, unspecified: Secondary | ICD-10-CM

## 2019-07-04 DIAGNOSIS — Z79899 Other long term (current) drug therapy: Secondary | ICD-10-CM

## 2019-07-04 DIAGNOSIS — R278 Other lack of coordination: Secondary | ICD-10-CM

## 2019-07-04 DIAGNOSIS — F902 Attention-deficit hyperactivity disorder, combined type: Secondary | ICD-10-CM | POA: Diagnosis not present

## 2019-07-04 DIAGNOSIS — Z7189 Other specified counseling: Secondary | ICD-10-CM

## 2019-07-04 MED ORDER — LISDEXAMFETAMINE DIMESYLATE 30 MG PO CAPS: 30.0000 mg | ORAL_CAPSULE | Freq: Every day | ORAL | 0 refills | Status: DC

## 2019-07-04 MED ORDER — VYVANSE 70 MG PO CAPS: 70.0000 mg | ORAL_CAPSULE | Freq: Every day | ORAL | 0 refills | Status: DC

## 2019-07-04 NOTE — Patient Instructions (Addendum)
DISCUSSION: Counseled regarding the following coordination of care items:  Continue medication as directed Vyvanse 70 mg every morning Vyvanse 30 mg prn RX for above e-scribed and sent to pharmacy on record  [id number] [place], [place] [address] Phone: [phone] Fax: [phone]  Counseled medication administration, effects, and possible side effects.  ADHD medications discussed to include different medications and pharmacologic properties of each. Recommendation for specific medication to include dose, administration, expected effects, possible side effects and the risk to benefit ratio of medication management.  Advised importance of:  Good sleep hygiene (8- 10 hours per night)  Limited screen time (none on school nights, no more than 2 hours on weekends)  Regular exercise(outside and active play)  Healthy eating (drink water, no sodas/sweet tea)  Regular family meals have been linked to lower levels of adolescent risk-taking behavior.  Adolescents who frequently eat meals with their family are less likely to engage in risk behaviors than those who never or rarely eat with their families.  So it is never too early to start this tradition.  Counseling at this visit included the review of old records and/or current chart.   Counseling included the following discussion points presented at every visit to improve understanding and treatment compliance.  Recent health history and today's examination Growth and development with anticipatory guidance provided regarding brain growth, executive function maturation and pre or pubertal development. School progress and continued advocay for appropriate accommodations to include maintain Structure, routine, organization, reward, motivation and consequences.  Additionally the patient was counseled to take medication while driving.

## 2019-07-04 NOTE — Progress Notes (Signed)
[facility] [address], [place]. [address] [place] [address] Dept: [phone] Dept Fax: [phone]  Medication Check by FaceTime due to COVID-19  Patient ID:  Bruce Barton  male DOB: [birth year]   18 y.o.   MRN: [id number]   DATE:07/04/19  PCP: Bernadette Hoit, MD  Interviewed: [name]  Location: His employment (no others present) Provider location: [facility] office  Virtual Visit via Video Note Connected with [name] on 07/04/19 at  9:00 AM EDT by video enabled telemedicine application and verified that I am speaking with the correct person using two identifiers.     I discussed the limitations, risks, security and privacy concerns of performing an evaluation and management service by telephone and the availability of in person appointments. I also discussed with the parent/patient that there may be a patient responsible charge related to this service. The parent/patient expressed understanding and agreed to proceed.  HISTORY OF PRESENT ILLNESS/CURRENT STATUS: [name] is being followed for medication management for ADHD, dysgraphia and learning differences..   Last visit on 03/06/2019  Bralynn currently prescribed Vyvanse 70 mg taking every morning.  Will use 30 mg, two when runs out of 70 mg.    Behaviors doing well.  Last visit was interviewing for job at [employer], got the job!  Currently at work, multitasking, doing well.  Eating well (eating breakfast, lunch and dinner).   Sleeping: bedtime 2200 pm  Sleeping through the night.   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  Activities/ Exercise: daily  Screen time: (phone, tablet, TV, computer): non-essential, not excessive  MEDICAL HISTORY: Individual Medical History/ Review of Systems: Changes? :No  Family Medical/ Social History: Changes? No   Patient  Lives with: mother and father  Current Medications:  Vyvanse 70 mg every morning Vyvanse 30 mg prn  Medication Side Effects: None  MENTAL HEALTH: Mental Health Issues:    Denies sadness, loneliness or depression. No self harm or thoughts of self harm or injury. Denies fears, worries and anxieties. Has good peer relations and is not a bully nor is victimized. Coping seems to be doing well.  DIAGNOSES:    ICD-10-CM   1. Attention deficit hyperactivity disorder (ADHD), combined type  F90.2   2. Dysgraphia  R27.8   3. Medication management  Z79.899   4. Patient counseled  Z71.9   5. Counseling and coordination of care  Z71.89      RECOMMENDATIONS:  Patient Instructions  DISCUSSION: Counseled regarding the following coordination of care items:  Continue medication as directed Vyvanse 70 mg every morning Vyvanse 30 mg prn RX for above e-scribed and sent to pharmacy on record  [id number] [place] - [address] [place] [address] Phone: [phone] Fax: [phone]  Counseled medication administration, effects, and possible side effects.  ADHD medications discussed to include different medications and pharmacologic properties of each. Recommendation for specific medication to include dose, administration, expected effects, possible side effects and the risk to benefit ratio of medication management.  Advised importance of:  Good sleep hygiene (8- 10 hours per night)  Limited screen time (none on school nights, no more than 2 hours on weekends)  Regular exercise(outside and active play)  Healthy eating (drink water, no sodas/sweet tea)  Regular family meals have been linked to lower levels of adolescent risk-taking behavior.  Adolescents who frequently  eat meals with their family are less likely to engage in risk behaviors than those who never or rarely eat with their families.  So it is never too early to start this  tradition.  Counseling at this visit included the review of old records and/or current chart.   Counseling included the following discussion points presented at every visit to improve understanding and treatment compliance.  Recent health history and today's examination Growth and development with anticipatory guidance provided regarding brain growth, executive function maturation and pre or pubertal development. School progress and continued advocay for appropriate accommodations to include maintain Structure, routine, organization, reward, motivation and consequences.  Additionally the patient was counseled to take medication while driving.         Discussed continued need for routine, structure, motivation, reward and positive reinforcement  Encouraged recommended limitations on TV, tablets, phones, video games and computers for non-educational activities.  Encouraged physical activity and outdoor play, maintaining social distancing.  Discussed how to talk to anxious children about coronavirus.   Referred to ADDitudemag.com for resources about engaging children who are at home in home and online study.    NEXT APPOINTMENT:  Return in about 3 months (around 10/04/2019) for Medication Check. Please call the office for a sooner appointment if problems arise.  Medical Decision-making: More than 50% of the appointment was spent counseling and discussing diagnosis and management of symptoms with the parent/patient.  I discussed the assessment and treatment plan with the parent. The parent/patient was provided an opportunity to ask questions and all were answered. The parent/patient agreed with the plan and demonstrated an understanding of the instructions.   The parent/patient was advised to call back or seek an in-person evaluation if the symptoms worsen or if the condition fails to improve as anticipated.  I provided 25 minutes of non-face-to-face time during this encounter.    Completed record review for 0 minutes prior to the virtual video visit.   Len Childs, NP  Counseling Time: 25 minutes   Total Contact Time: 25 minutes

## 2019-09-05 ENCOUNTER — Other Ambulatory Visit: Payer: Self-pay

## 2019-09-05 MED ORDER — VYVANSE 70 MG PO CAPS: 70.0000 mg | ORAL_CAPSULE | Freq: Every day | ORAL | 0 refills | Status: DC

## 2019-09-05 NOTE — Telephone Encounter (Signed)
Mom called in for refill for Vyvanse70mg . Last visit10/02/2019 next visit 10/02/2019. [facility] [place] [place]

## 2019-09-05 NOTE — Telephone Encounter (Signed)
E-Prescribed Vyvanse 70 mg directly to  [id number] [place] [place] [facility] [address] [facility] [place] [address] Phone: [phone] Fax: [phone]

## 2019-10-02 ENCOUNTER — Encounter: Payer: Self-pay | Admitting: Pediatrics

## 2019-10-02 ENCOUNTER — Other Ambulatory Visit: Payer: Self-pay

## 2019-10-02 ENCOUNTER — Ambulatory Visit (INDEPENDENT_AMBULATORY_CARE_PROVIDER_SITE_OTHER): Payer: BC Managed Care – PPO | Admitting: Pediatrics

## 2019-10-02 DIAGNOSIS — Z719 Counseling, unspecified: Secondary | ICD-10-CM | POA: Diagnosis not present

## 2019-10-02 DIAGNOSIS — Z7189 Other specified counseling: Secondary | ICD-10-CM

## 2019-10-02 DIAGNOSIS — F902 Attention-deficit hyperactivity disorder, combined type: Secondary | ICD-10-CM

## 2019-10-02 DIAGNOSIS — Z79899 Other long term (current) drug therapy: Secondary | ICD-10-CM | POA: Diagnosis not present

## 2019-10-02 DIAGNOSIS — R278 Other lack of coordination: Secondary | ICD-10-CM | POA: Diagnosis not present

## 2019-10-02 MED ORDER — VYVANSE 70 MG PO CAPS: 70.0000 mg | ORAL_CAPSULE | Freq: Every day | ORAL | 0 refills | Status: DC

## 2019-10-02 NOTE — Patient Instructions (Signed)
DISCUSSION: Counseled regarding the following coordination of care items:  Continue medication as directed Vyvanse 70 mg every morning RX for above e-scribed and sent to pharmacy on record  [id number] [place], [place] - [address] [place] [address] Phone: [phone] Fax: [phone]   Counseled medication administration, effects, and possible side effects.  ADHD medications discussed to include different medications and pharmacologic properties of each. Recommendation for specific medication to include dose, administration, expected effects, possible side effects and the risk to benefit ratio of medication management.  Advised importance of:  Good sleep hygiene (8- 10 hours per night)  Limited screen time (none on school nights, no more than 2 hours on weekends)  Regular exercise(outside and active play)  Healthy eating (drink water, no sodas/sweet tea)  Regular family meals have been linked to lower levels of adolescent risk-taking behavior.  Adolescents who frequently eat meals with their family are less likely to engage in risk behaviors than those who never or rarely eat with their families.  So it is never too early to start this tradition.  Counseling at this visit included the review of old records and/or current chart.   Counseling included the following discussion points presented at every visit to improve understanding and treatment compliance.  Recent health history and today's examination Growth and development with anticipatory guidance provided regarding brain growth, executive function maturation and pre or pubertal development. School progress and continued advocay for appropriate accommodations to include maintain Structure, routine, organization, reward, motivation and consequences.  Additionally the patient was counseled to take medication while driving.  Smoking cessation   1.  Almost all adult tobacco users start when  they are teens. 2.  While tobacco use rates have been declining among youth over the past 20 years, almost a quarter of teens still use tobacco. 3. Tobacco-using teens are more likely to use other substances, become involved in violent behavior, be sexually active, and are at a higher risk for depression and suicide. Local Resources: [place] Quitline SpiritualAlarm.tn 1-800-QUIT-NOW  [facility] Tobacco Prevention and Control Mary Gillett mgillet@co .[place].us 498-264-1583    Online Resources: American Cancer Society  http://www.cancer.org  Teens Health  EarlyMetal.com.br  Centers for Disease Control and Prevention ReviewTheMovie.co.za.htm

## 2019-10-02 NOTE — Progress Notes (Signed)
[facility] [address], [place]. [address] [place] [address] Dept: [phone] Dept Fax: [phone]  Medication Check by FaceTime due to COVID-19  Patient ID:  Bruce Barton  male DOB: [birth year]   18 y.o.   MRN: [id number]   DATE:10/02/19  PCP: Bernadette Hoit, MD  Interviewed: Bruce Barton and Mother  Name: Bruce Barton Location: Their home Provider location: [facility] office  Virtual Visit via Video Note Connected with Bruce Barton on 10/02/19 at  9:30 AM EST by video enabled telemedicine application and verified that I am speaking with the correct person using two identifiers.     I discussed the limitations, risks, security and privacy concerns of performing an evaluation and management service by telephone and the availability of in person appointments. I also discussed with the parent/patient that there may be a patient responsible charge related to this service. The parent/patient expressed understanding and agreed to proceed.  HISTORY OF PRESENT ILLNESS/CURRENT STATUS: Bruce Barton is being followed for medication management for ADHD, dysgraphia and learning differences.   Last visit on 07/04/2019  Shamel currently prescribed Vyvanse 70 mg every morning, using 30 mg as needed for weekends or breaks.  Behaviors: doing well, some challenges lately with life issues (lost job, broke up with girl friend).  Eating well (eating breakfast, lunch and dinner).   Sleeping: some challenges falling asleep.  Counseled to set sleep schedule. Counseled regarding nicotine and smoking/vape cessation.  Employment: Lost job at [employer], was not getting along well with coworkers.   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 Counseled regarding seeking employment.  Activities/ Exercise: daily  Screen time: (phone, tablet, TV, computer): non-essential, not excessive Seeking employment.  MEDICAL  HISTORY: Individual Medical History/ Review of Systems: Changes? :No  Family Medical/ Social History: Changes? No   Patient Lives with: mother and father  Current Medications:  Vyvanse 70 mg every morning Vyvanse 30 mg using on weekends and sporadically  Medication Side Effects: None  MENTAL HEALTH: Lost job, no longer with girlfriend, all around holidays. Feeling some depression, but not suicidal or planning. Counseled to seek counseling.  DIAGNOSES:    ICD-10-CM   1. Attention deficit hyperactivity disorder (ADHD), combined type  F90.2   2. Dysgraphia  R27.8   3. Medication management  Z79.899   4. Patient counseled  Z71.9   5. Parenting dynamics counseling  Z71.89   6. Counseling and coordination of care  Z71.89    RECOMMENDATIONS:  Patient Instructions  DISCUSSION: Counseled regarding the following coordination of care items:  Continue medication as directed Vyvanse 70 mg every morning RX for above e-scribed and sent to pharmacy on record  [id number], [place] - [address] [place] [address] Phone: [phone] Fax: [phone]   Counseled medication administration, effects, and possible side effects.  ADHD medications discussed to include different medications and pharmacologic properties of each. Recommendation for specific medication to include dose, administration, expected effects, possible side effects and the risk to benefit ratio of medication management.  Advised importance of:  Good sleep hygiene (8- 10 hours per night)  Limited screen time (none on school nights, no more than 2 hours on weekends)  Regular exercise(outside and active play)  Healthy eating (drink water, no sodas/sweet tea)  Regular family meals have been linked to lower levels of adolescent risk-taking behavior.  Adolescents who frequently eat meals with their family are less likely  to engage in risk behaviors than those who never or rarely  eat with their families.  So it is never too early to start this tradition.  Counseling at this visit included the review of old records and/or current chart.   Counseling included the following discussion points presented at every visit to improve understanding and treatment compliance.  Recent health history and today's examination Growth and development with anticipatory guidance provided regarding brain growth, executive function maturation and pre or pubertal development. School progress and continued advocay for appropriate accommodations to include maintain Structure, routine, organization, reward, motivation and consequences.  Additionally the patient was counseled to take medication while driving.  Smoking cessation   1.  Almost all adult tobacco users start when they are teens. 2.  While tobacco use rates have been declining among youth over the past 20 years, almost a quarter of teens still use tobacco. 3. Tobacco-using teens are more likely to use other substances, become involved in violent behavior, be sexually active, and are at a higher risk for depression and suicide. Local Resources: [place] Quitline https://www.young.biz/ 1-800-QUIT-NOW  [facility] Tobacco Prevention and Control Mary Gillett mgillet@co .[place].us 155-208-0223    Online Resources: American Cancer Society  http://www.cancer.[place]  http://hunter.com/  Centers for Disease Control and Prevention RegulatorBlog.com.cy       Discussed continued need for routine, structure, motivation, reward and positive reinforcement  Encouraged recommended limitations on TV, tablets, phones, video games and computers for non-educational activities.  Encouraged physical activity and outdoor play, maintaining social distancing.  Discussed how to talk to anxious children about coronavirus.   Referred to  ADDitudemag.com for resources about engaging children who are at home in home and online study.    NEXT APPOINTMENT:  Return in about 3 months (around 12/31/2019) for Medication Check. Please call the office for a sooner appointment if problems arise.  Medical Decision-making: More than 50% of the appointment was spent counseling and discussing diagnosis and management of symptoms with the parent/patient.  I discussed the assessment and treatment plan with the parent. The parent/patient was provided an opportunity to ask questions and all were answered. The parent/patient agreed with the plan and demonstrated an understanding of the instructions.   The parent/patient was advised to call back or seek an in-person evaluation if the symptoms worsen or if the condition fails to improve as anticipated.  I provided 25 minutes of non-face-to-face time during this encounter.   Completed record review for 0 minutes prior to the virtual video visit.   Len Childs, NP  Counseling Time: 25 minutes   Total Contact Time: 25 minutes

## 2019-11-06 ENCOUNTER — Other Ambulatory Visit: Payer: Self-pay

## 2019-11-06 MED ORDER — VYVANSE 70 MG PO CAPS: 70.0000 mg | ORAL_CAPSULE | Freq: Every day | ORAL | 0 refills | Status: DC

## 2019-11-06 NOTE — Telephone Encounter (Signed)
RX for above e-scribed and sent to pharmacy on record  [id number] [place] [place] - [address] [place] [address] Phone: [phone] Fax: [phone]   

## 2019-11-06 NOTE — Telephone Encounter (Signed)
Mom called in for refill for Vyvanse70mg . Last visit1/12/2019. [facility] [place], [place]

## 2019-12-08 ENCOUNTER — Other Ambulatory Visit: Payer: Self-pay

## 2019-12-08 MED ORDER — VYVANSE 70 MG PO CAPS: 70.0000 mg | ORAL_CAPSULE | Freq: Every day | ORAL | 0 refills | Status: DC

## 2019-12-08 NOTE — Telephone Encounter (Signed)
Vyvanse 70 mg daily, # 30 with no RF's.RX for above e-scribed and sent to pharmacy on record  [id number] [place] [place] - [address] [place] [address] Phone: [phone] Fax: [phone]   

## 2019-12-08 NOTE — Telephone Encounter (Signed)
Mom called in for refill for Vyvanse70mg . Last visit1/12/2019 next visit 01/05/2020. [facility] [place] [place]

## 2020-01-03 ENCOUNTER — Other Ambulatory Visit: Payer: Self-pay | Admitting: Pediatrics

## 2020-01-03 MED ORDER — VYVANSE 70 MG PO CAPS: 70.0000 mg | ORAL_CAPSULE | Freq: Every day | ORAL | 0 refills | Status: DC

## 2020-01-03 NOTE — Telephone Encounter (Signed)
Vyvanse 70 mg daily, # 30 with no RF's.RX for above e-scribed and sent to pharmacy on record  [id number] [place] [place] - [address] [place] [address] Phone: [phone] Fax: [phone]   

## 2020-01-03 NOTE — Telephone Encounter (Signed)
Mom called for refill for Vyvanse 70 mg.  Patient last seen 10/02/19, next appointment 01/05/20.  [facility] [address], [place].

## 2020-01-05 ENCOUNTER — Ambulatory Visit (INDEPENDENT_AMBULATORY_CARE_PROVIDER_SITE_OTHER): Payer: BC Managed Care – PPO | Admitting: Pediatrics

## 2020-01-05 ENCOUNTER — Encounter: Payer: Self-pay | Admitting: Pediatrics

## 2020-01-05 ENCOUNTER — Other Ambulatory Visit: Payer: Self-pay

## 2020-01-05 DIAGNOSIS — Z79899 Other long term (current) drug therapy: Secondary | ICD-10-CM

## 2020-01-05 DIAGNOSIS — Z719 Counseling, unspecified: Secondary | ICD-10-CM

## 2020-01-05 DIAGNOSIS — R278 Other lack of coordination: Secondary | ICD-10-CM | POA: Diagnosis not present

## 2020-01-05 DIAGNOSIS — F902 Attention-deficit hyperactivity disorder, combined type: Secondary | ICD-10-CM | POA: Diagnosis not present

## 2020-01-05 DIAGNOSIS — Z7189 Other specified counseling: Secondary | ICD-10-CM

## 2020-01-05 NOTE — Progress Notes (Signed)
[facility] [place]. [address] [place] [address] Dept: [phone] Dept Fax: [phone]  Medication Check by Facetime due to COVID-19  Patient ID:  Bruce Barton  male DOB: Sep 26, 2001   19 y.o.   MRN: [id number]   DATE:01/05/20  PCP: Bruce Libra, MD  Interviewed: Bruce Barton  Location: His home Provider location: [facility] office  Virtual Visit via Video Note Connected with Bruce Barton on 01/05/20 at  2:00 PM EDT by video enabled telemedicine application and verified that I am speaking with the correct person using two identifiers.     I discussed the limitations, risks, security and privacy concerns of performing an evaluation and management service by telephone and the availability of in person appointments. I also discussed with the parent/patient that there may be a patient responsible charge related to this service. The parent/patient expressed understanding and agreed to proceed.  HISTORY OF PRESENT ILLNESS/CURRENT STATUS: Bruce Barton is being followed for medication management for ADHD, dysgraphia and learning differences.   Last visit on 10/02/19  Bruce Barton currently prescribed Vyvanse 70 mg every morning.  Has Vyvanse 3o mg for weekends and breaks    Behaviors: doing well, employed  Eating well (eating breakfast, lunch and dinner).   Elimination: no concerns  Sleeping: bedtime 2300 pm awake by 0700 Sleeping through the night.   EMPLOYMENT: Working for [employer] and trying to get a [employer] job. In process, they are running a background check now.  MEDICAL HISTORY: Individual Medical History/ Review of Systems: Changes? :Yes  Ingrown hair, got celluitis on antibiotic Family Medical/ Social History: Changes? No   Patient Lives with: mother and father  Brothers out of home with fiance family.  MENTAL HEALTH: Mental Health Issues:    Denies sadness, loneliness or depression. No self  harm or thoughts of self harm or injury. Denies fears, worries and anxieties. Has good peer relations and is not a bully nor is victimized.   DIAGNOSES:    ICD-10-CM   1. Attention deficit hyperactivity disorder (ADHD), combined type  F90.2   2. Dysgraphia  R27.8   3. Medication management  Z79.899   4. Patient counseled  Z71.9   5. Counseling and coordination of care  Z71.89      RECOMMENDATIONS:  Patient Instructions  DISCUSSION: Counseled regarding the following coordination of care items:  Continue medication as directed Vyvanse 70 mg every morning Vyvanse 30 mg on weekends  RX for above e-scribed and sent to pharmacy on record  [id number] [place] [place] [address] Phone: [phone] Fax: [phone]  Counseled regarding obtaining refills by calling pharmacy first to use automated refill request then if needed, call our office leaving a detailed message on the refill line.  Counseled medication administration, effects, and possible side effects.  ADHD medications discussed to include different medications and pharmacologic properties of each. Recommendation for specific medication to include dose, administration, expected effects, possible side effects and the risk to benefit ratio of medication management.  Advised importance of:  Good sleep hygiene (8- 10 hours per night)  Limited screen time (none on school nights, no more than 2 hours on weekends)  Regular exercise(outside and active play)  Healthy eating (drink water, no sodas/sweet tea)  Regular family meals have been linked to lower levels of adolescent risk-taking behavior.  Adolescents who frequently eat meals with their family are less likely to engage in  risk behaviors than those who never or rarely eat with their families.  So it is never too early to start this tradition.       Discussed continued need for routine, structure, motivation,  reward and positive reinforcement  Encouraged recommended limitations on TV, tablets, phones, video games and computers for non-educational activities.  Encouraged physical activity and outdoor play, maintaining social distancing.   Referred to ADDitudemag.com for resources about ADHD, engaging children who are at home in home and online study.    NEXT APPOINTMENT:  Return in about 6 months (around 07/06/2020) for Medication Check. Please call the office for a sooner appointment if problems arise.  Medical Decision-making: More than 50% of the appointment was spent counseling and discussing diagnosis and management of symptoms with the parent/patient.  I discussed the assessment and treatment plan with the parent. The parent/patient was provided an opportunity to ask questions and all were answered. The parent/patient agreed with the plan and demonstrated an understanding of the instructions.   The parent/patient was advised to call back or seek an in-person evaluation if the symptoms worsen or if the condition fails to improve as anticipated.  I provided 25 minutes of non-face-to-face time during this encounter.   Completed record review for 0 minutes prior to the virtual video visit.   Bruce Penna, NP  Counseling Time: 25 minutes   Total Contact Time: 25 minutes

## 2020-01-05 NOTE — Patient Instructions (Signed)
DISCUSSION: Counseled regarding the following coordination of care items:  Continue medication as directed Vyvanse 70 mg every morning Vyvanse 30 mg on weekends  RX for above e-scribed and sent to pharmacy on record  [id number] [place] - [address] [place] [address] Phone: [phone] Fax: [phone]  Counseled regarding obtaining refills by calling pharmacy first to use automated refill request then if needed, call our office leaving a detailed message on the refill line.  Counseled medication administration, effects, and possible side effects.  ADHD medications discussed to include different medications and pharmacologic properties of each. Recommendation for specific medication to include dose, administration, expected effects, possible side effects and the risk to benefit ratio of medication management.  Advised importance of:  Good sleep hygiene (8- 10 hours per night)  Limited screen time (none on school nights, no more than 2 hours on weekends)  Regular exercise(outside and active play)  Healthy eating (drink water, no sodas/sweet tea)  Regular family meals have been linked to lower levels of adolescent risk-taking behavior.  Adolescents who frequently eat meals with their family are less likely to engage in risk behaviors than those who never or rarely eat with their families.  So it is never too early to start this tradition.

## 2020-01-31 ENCOUNTER — Other Ambulatory Visit: Payer: Self-pay | Admitting: Pediatrics

## 2020-01-31 MED ORDER — VYVANSE 70 MG PO CAPS: 70.0000 mg | ORAL_CAPSULE | Freq: Every day | ORAL | 0 refills | Status: DC

## 2020-01-31 NOTE — Telephone Encounter (Signed)
Mom called for refill for Vvyanse 70 mg.  Patient last seen 01/05/20, next appointment 05/03/20.  [facility] [address] [facility] [place]. 

## 2020-01-31 NOTE — Telephone Encounter (Signed)
RX for above e-scribed and sent to pharmacy on record  [id number] [place] [place] - [address] [place] [address] Phone: [phone] Fax: [phone]   

## 2020-03-01 ENCOUNTER — Other Ambulatory Visit: Payer: Self-pay

## 2020-03-01 MED ORDER — VYVANSE 70 MG PO CAPS: 70.0000 mg | ORAL_CAPSULE | Freq: Every day | ORAL | 0 refills | Status: DC

## 2020-03-01 NOTE — Telephone Encounter (Signed)
RX for above e-scribed and sent to pharmacy on record  [id number] [place] [place] - [address] [place] [address] Phone: [phone] Fax: [phone]   

## 2020-03-01 NOTE — Telephone Encounter (Signed)
Mom called for refill for Bruce Barton 70 mg.  Patient last seen 01/05/20, next appointment 05/03/20.  Please e[facility]scribe to [facility] [address] in [place].

## 2020-03-25 ENCOUNTER — Other Ambulatory Visit: Payer: Self-pay

## 2020-03-25 MED ORDER — VYVANSE 70 MG PO CAPS: 70.0000 mg | ORAL_CAPSULE | Freq: Every day | ORAL | 0 refills | Status: DC

## 2020-03-25 NOTE — Telephone Encounter (Signed)
E-Prescribed Vyvanse 70 directly to  [id number], [place] - [address] [place] [address] Phone: [phone] Fax: [phone]

## 2020-03-25 NOTE — Telephone Encounter (Signed)
Mom called for refill for Vvyanse 70 mg.  Patient last seen 01/05/20, next appointment 05/03/20.  [facility]scribe to [facility] [address] in [place]. 

## 2020-05-03 ENCOUNTER — Other Ambulatory Visit: Payer: Self-pay

## 2020-05-03 ENCOUNTER — Ambulatory Visit (INDEPENDENT_AMBULATORY_CARE_PROVIDER_SITE_OTHER): Payer: BC Managed Care – PPO | Admitting: Pediatrics

## 2020-05-03 ENCOUNTER — Encounter: Payer: Self-pay | Admitting: Pediatrics

## 2020-05-03 VITALS — Ht 68.5 in | Wt 189.0 lb

## 2020-05-03 DIAGNOSIS — Z719 Counseling, unspecified: Secondary | ICD-10-CM

## 2020-05-03 DIAGNOSIS — R278 Other lack of coordination: Secondary | ICD-10-CM | POA: Diagnosis not present

## 2020-05-03 DIAGNOSIS — F902 Attention-deficit hyperactivity disorder, combined type: Secondary | ICD-10-CM

## 2020-05-03 DIAGNOSIS — Z79899 Other long term (current) drug therapy: Secondary | ICD-10-CM | POA: Diagnosis not present

## 2020-05-03 DIAGNOSIS — Z7189 Other specified counseling: Secondary | ICD-10-CM

## 2020-05-03 MED ORDER — LISDEXAMFETAMINE DIMESYLATE 30 MG PO CAPS: 30.0000 mg | ORAL_CAPSULE | Freq: Every day | ORAL | 0 refills | Status: DC

## 2020-05-03 MED ORDER — LISDEXAMFETAMINE DIMESYLATE 70 MG PO CAPS: 70.0000 mg | ORAL_CAPSULE | Freq: Every day | ORAL | 0 refills | Status: DC

## 2020-05-03 NOTE — Patient Instructions (Addendum)
DISCUSSION: Counseled regarding the following coordination of care items:  Continue medication as directed Vyvanse 70 mg every morning Vyvanse 30 mg for weekend and break  RX for above e-scribed and sent to pharmacy on record  [id number], [place] - [address] [place] [address] Phone: [phone] Fax: [phone]   Counseled regarding obtaining refills by calling pharmacy first to use automated refill request then if needed, call our office leaving a detailed message on the refill line.  Counseled medication administration, effects, and possible side effects.  ADHD medications discussed to include different medications and pharmacologic properties of each. Recommendation for specific medication to include dose, administration, expected effects, possible side effects and the risk to benefit ratio of medication management.  Advised importance of:  Good sleep hygiene (8- 10 hours per night)  Limited screen time (none on school nights, no more than 2 hours on weekends)  Regular exercise(outside and active play)  Healthy eating (drink water, no sodas/sweet tea)  Regular family meals have been linked to lower levels of adolescent risk-taking behavior.  Adolescents who frequently eat meals with their family are less likely to engage in risk behaviors than those who never or rarely eat with their families.  So it is never too early to start this tradition.  Counseling at this visit included the review of old records and/or current chart.   Counseling included the following discussion points presented at every visit to improve understanding and treatment compliance.  Recent health history and today's examination Growth and development with anticipatory guidance provided regarding brain growth, executive function maturation and pre or pubertal development. School progress and continued advocay for appropriate accommodations to include maintain  Structure, routine, organization, reward, motivation and consequences.  Additionally the patient was counseled to take medication while driving.  Preventing and treating skin infections  Keep fingernails short to avoid breaking the skin if you do accidentally scratch an infection site.  . Use clean linens daily. This includes towels, washcloths, underwear and sleepwear.  Bruce Barton with an antibacterial soap such as Dial or Hibiclens one to two times per week.  . Take once weekly 15-minute bath in a full tub of water with  to  cup of bleach added to it. If this dries your skin,  apply a skin moisturizing cream after toweling off.

## 2020-05-03 NOTE — Progress Notes (Signed)
Medication Check  Patient ID: Bruce Barton  DOB: [birth year]  MRN: [id number]  DATE:05/03/20 Bernadette Hoit, MD  Accompanied by: Self Patient Lives with: mother and father   HISTORY/CURRENT STATUS: Chief Complaint - Polite and cooperative and present for medical follow up for medication management of ADHD, dysgraphia and learning differences.  Currently prescribed Vyvanse 70 mg every morning.  Last follow up 01/05/2020.  Doing well and has a new job.   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 3244-0102 summer off on Fridays and then 0700-1600  Activities/ Exercise: daily  Screen time: (phone, tablet, TV, computer): not excessive Thinking about classes Wants CDL through work  Driving: going well  MEDICAL HISTORY: Appetite: WNL   Sleep: Bedtime: 2200-2300  Awakens: 0700   Concerns: Initiation/Maintenance/Other: Asleep easily, sleeps through the night, feels well-rested.  No Sleep concerns.  Elimination: no concerns  Individual Medical History/ Review of Systems: Changes? :No  Family Medical/ Social History: Changes? No Brother engaged.  Current Medications:  Vyvanse 70 mg daily Vyvanse 30 mg - when running low on 70 mg Medication Side Effects: None  MENTAL HEALTH: Mental Health Issues:  Denies sadness, loneliness or depression. No self harm or thoughts of self harm or injury. Denies fears, worries and anxieties. Has good peer relations and is not a bully nor is victimized.  Review of Systems  Constitutional: Negative.   HENT: Negative.   Eyes: Negative.   Respiratory: Negative.   Cardiovascular: Negative.   Gastrointestinal: Negative.   Endocrine: Negative.   Genitourinary: Negative.   Musculoskeletal: Negative.   Allergic/Immunologic: Negative.   Neurological: Negative.  Negative for seizures, speech difficulty and headaches.  Hematological: Negative.   Psychiatric/Behavioral: Negative for behavioral problems, decreased  concentration, dysphoric mood and sleep disturbance. The patient is not nervous/anxious and is not hyperactive.   All other systems reviewed and are negative.   PHYSICAL EXAM; Vitals:   05/03/20 0848  Weight: 189 lb (85.7 kg)  Height: 5' 8.5" (1.74 m)   Body mass index is 28.32 kg/m.  General Physical Exam: Unchanged from previous exam, date:12/2019    DIAGNOSES:    ICD-10-CM   1. Attention deficit hyperactivity disorder (ADHD), combined type  F90.2   2. Dysgraphia  R27.8   3. Medication management  Z79.899   4. Patient counseled  Z71.9   5. Parenting dynamics counseling  Z71.89   6. Counseling and coordination of care  Z71.89     RECOMMENDATIONS:  Patient Instructions  DISCUSSION: Counseled regarding the following coordination of care items:  Continue medication as directed Vyvanse 70 mg every morning Vyvanse 30 mg for weekend and break  RX for above e-scribed and sent to pharmacy on record  [id number], [place] - [address] [place] [address] Phone: [phone] Fax: [phone]   Counseled regarding obtaining refills by calling pharmacy first to use automated refill request then if needed, call our office leaving a detailed message on the refill line.  Counseled medication administration, effects, and possible side effects.  ADHD medications discussed to include different medications and pharmacologic properties of each. Recommendation for specific medication to include dose, administration, expected effects, possible side effects and the risk to benefit ratio of medication management.  Advised importance of:  Good sleep hygiene (8- 10 hours per night)  Limited screen time (none on school nights, no more than 2 hours on weekends)  Regular exercise(outside and active play)  Healthy eating (drink water, no sodas/sweet tea)  Regular family meals have been linked to lower levels of adolescent risk-taking behavior.   Adolescents who frequently eat meals with their family are less likely to engage in risk behaviors than those who never or rarely eat with their families.  So it is never too early to start this tradition.  Counseling at this visit included the review of old records and/or current chart.   Counseling included the following discussion points presented at every visit to improve understanding and treatment compliance.  Recent health history and today's examination Growth and development with anticipatory guidance provided regarding brain growth, executive function maturation and pre or pubertal development. School progress and continued advocay for appropriate accommodations to include maintain Structure, routine, organization, reward, motivation and consequences.  Additionally the patient was counseled to take medication while driving.  Preventing and treating skin infections  Keep fingernails short to avoid breaking the skin if you do accidentally scratch an infection site.  . Use clean linens daily. This includes towels, washcloths, underwear and sleepwear.  Reyes Ivan with an antibacterial soap such as Dial or Hibiclens one to two times per week.  . Take once weekly 15-minute bath in a full tub of water with  to  cup of bleach added to it. If this dries your skin,  apply a skin moisturizing cream after toweling off.     Patient verbalized understanding of all topics discussed.  NEXT APPOINTMENT:  Return in about 6 months (around 11/03/2020) for Medical Follow up.  Medical Decision-making: More than 50% of the appointment was spent counseling and discussing diagnosis and management of symptoms with the patient and family.  Counseling Time: 25 minutes Total Contact Time: 30 minutes

## 2020-05-03 NOTE — Progress Notes (Signed)
Patient ID: Bruce Barton, male   DOB: 2001/09/05, 19 y.o.   MRN: [id number]

## 2020-06-11 ENCOUNTER — Other Ambulatory Visit: Payer: Self-pay

## 2020-06-11 NOTE — Telephone Encounter (Signed)
Mom called for refill for Vvyanse. Last visit 05/03/20. Please e-scribe to [facility] [address] in [place].

## 2020-06-12 MED ORDER — LISDEXAMFETAMINE DIMESYLATE 70 MG PO CAPS: 70.0000 mg | ORAL_CAPSULE | Freq: Every day | ORAL | 0 refills | Status: DC

## 2020-06-12 MED ORDER — LISDEXAMFETAMINE DIMESYLATE 30 MG PO CAPS: 30.0000 mg | ORAL_CAPSULE | Freq: Every day | ORAL | 0 refills | Status: DC

## 2020-06-12 NOTE — Telephone Encounter (Signed)
E-Prescribed Vyvanse 70 and Vyvanse 30 directly to  [id number], [place] - [address] [place] [address] Phone: [phone] Fax: [phone]

## 2020-07-17 ENCOUNTER — Telehealth: Payer: Self-pay

## 2020-07-17 MED ORDER — LISDEXAMFETAMINE DIMESYLATE 30 MG PO CAPS
30.0000 mg | ORAL_CAPSULE | Freq: Every day | ORAL | 0 refills | Status: DC
Start: 2020-07-17 — End: 2020-09-05

## 2020-07-17 MED ORDER — LISDEXAMFETAMINE DIMESYLATE 70 MG PO CAPS
70.0000 mg | ORAL_CAPSULE | Freq: Every day | ORAL | 0 refills | Status: DC
Start: 2020-07-17 — End: 2020-08-20

## 2020-07-17 NOTE — Telephone Encounter (Signed)
Mom called for refill for Vvyanse. Last visit 05/03/20. Please e-scribe to [facility]  [address] in [place].

## 2020-07-17 NOTE — Telephone Encounter (Signed)
E-Prescribed Vyvanse 30 and 70 directly to  [id number], [place] - [address] [place] [address] Phone: [phone] Fax: [phone]

## 2020-08-20 ENCOUNTER — Other Ambulatory Visit: Payer: Self-pay

## 2020-08-20 MED ORDER — LISDEXAMFETAMINE DIMESYLATE 70 MG PO CAPS
70.0000 mg | ORAL_CAPSULE | Freq: Every day | ORAL | 0 refills | Status: DC
Start: 2020-08-20 — End: 2020-09-05

## 2020-08-20 NOTE — Telephone Encounter (Signed)
Mom called for refill for Vvyanse 70mg .Last visit8/6/21 next visit 09/05/2020. Please e-scribe to [facility] [address] in [place].

## 2020-08-20 NOTE — Telephone Encounter (Signed)
RX for above e-scribed and sent to pharmacy on record  [id number] [place], [place] - [address] [place] [address] Phone: [phone] Fax: [phone]   

## 2020-09-05 ENCOUNTER — Encounter: Payer: Self-pay | Admitting: Pediatrics

## 2020-09-05 ENCOUNTER — Telehealth (INDEPENDENT_AMBULATORY_CARE_PROVIDER_SITE_OTHER): Payer: BC Managed Care – PPO | Admitting: Pediatrics

## 2020-09-05 ENCOUNTER — Other Ambulatory Visit: Payer: Self-pay

## 2020-09-05 DIAGNOSIS — R278 Other lack of coordination: Secondary | ICD-10-CM

## 2020-09-05 DIAGNOSIS — F902 Attention-deficit hyperactivity disorder, combined type: Secondary | ICD-10-CM

## 2020-09-05 DIAGNOSIS — Z79899 Other long term (current) drug therapy: Secondary | ICD-10-CM

## 2020-09-05 DIAGNOSIS — Z719 Counseling, unspecified: Secondary | ICD-10-CM | POA: Diagnosis not present

## 2020-09-05 DIAGNOSIS — Z7189 Other specified counseling: Secondary | ICD-10-CM

## 2020-09-05 MED ORDER — LISDEXAMFETAMINE DIMESYLATE 30 MG PO CAPS: 30.0000 mg | ORAL_CAPSULE | Freq: Every day | ORAL | 0 refills | Status: DC

## 2020-09-05 MED ORDER — LISDEXAMFETAMINE DIMESYLATE 70 MG PO CAPS
70.0000 mg | ORAL_CAPSULE | Freq: Every day | ORAL | 0 refills | Status: DC
Start: 2020-09-05 — End: 2020-09-26

## 2020-09-05 NOTE — Patient Instructions (Signed)
DISCUSSION: Counseled regarding the following coordination of care items:  Continue medication as directed Vyvanse 70 mg every morning Vyvanse 30 mg for weekends RX for above e-scribed and sent to pharmacy on record  [id number], [place] - [address] [place] [address] Phone: [phone] Fax: [phone]  Counseled regarding obtaining refills by calling pharmacy first to use automated refill request then if needed, call our office leaving a detailed message on the refill line.  Counseled medication administration, effects, and possible side effects.  ADHD medications discussed to include different medications and pharmacologic properties of each. Recommendation for specific medication to include dose, administration, expected effects, possible side effects and the risk to benefit ratio of medication management.  Advised importance of:  Good sleep hygiene (8- 10 hours per night)  Limited screen time (none on school nights, no more than 2 hours on weekends)  Regular exercise(outside and active play)  Healthy eating (drink water, no sodas/sweet tea)  Regular family meals have been linked to lower levels of adolescent risk-taking behavior.  Adolescents who frequently eat meals with their family are less likely to engage in risk behaviors than those who never or rarely eat with their families.  So it is never too early to start this tradition.  Counseling at this visit included the review of old records and/or current chart.   Counseling included the following discussion points presented at every visit to improve understanding and treatment compliance.  Recent health history and today's examination Growth and development with anticipatory guidance provided regarding brain growth, executive function maturation and pre or pubertal development. School progress and continued advocay for appropriate accommodations to include maintain Structure,  routine, organization, reward, motivation and consequences.  Additionally the patient was counseled to take medication while driving.

## 2020-09-05 NOTE — Progress Notes (Signed)
[facility] [address] [place] [place] [address] Dept: [phone] Dept Fax: [phone]  Medication Check by Caregility due to COVID-19  Patient ID:  Bruce Barton  male DOB: 09/07/2001   19 y.o.   MRN: [id number]   DATE:09/05/20  PCP: Bruce Hoit, MD  Interviewed: Bruce Barton  Location: work vehicle not driving, no others present Provider location: [facility] office  Virtual Visit via Video Note Connected with Bruce Barton on 09/05/20 at  3:00 PM EST by video enabled telemedicine application and verified that I am speaking with the correct person using two identifiers.     I discussed the limitations, risks, security and privacy concerns of performing an evaluation and management service by telephone and the availability of in person appointments. I also discussed with the parent/patient that there may be a patient responsible charge related to this service. The parent/patient expressed understanding and agreed to proceed.  HISTORY OF PRESENT ILLNESS/CURRENT STATUS: Bruce Barton is being followed for medication management for ADHD, dysgraphia and learning differnces.   Last visit on 05/03/20  Auren currently prescribed Vyvanse 70 mg taking one daily.  Using Vyvanse 30 mg on days he forgets or runs out of his Vyvanse 70 mg    Behaviors: doing well, working for [employer], looking for work for third shift to make money and save up. Still lives with parents.  Eating well (eating breakfast, lunch and dinner).   Elimination: no concerns  Sleeping:Sleeping through the night.   Employment: [employer] full time.  Activities/ Exercise: daily  Screen time: (phone, tablet, TV, computer): non-essential, no time.  MEDICAL HISTORY: Individual Medical History/ Review of Systems: Changes? :No  Family Medical/ Social History: Changes? Yes brother getting married   Patient Lives with: mother and  father  Current Medications:  Vyvanse 70 mg every morning Uses Vyvanse 30 mg when runs out of 70 mg  Medication Side Effects: None  MENTAL HEALTH: Mental Health Issues:    Denies sadness, loneliness or depression. No self harm or thoughts of self harm or injury. Denies fears, worries and anxieties. Has good peer relations and is not a bully nor is victimized.  DIAGNOSES:    ICD-10-CM   1. Attention deficit hyperactivity disorder (ADHD), combined type  F90.2   2. Dysgraphia  R27.8   3. Medication management  Z79.899   4. Patient counseled  Z71.9   5. Parenting dynamics counseling  Z71.89   6. Counseling and coordination of care  Z71.89      RECOMMENDATIONS:  Patient Instructions  DISCUSSION: Counseled regarding the following coordination of care items:  Continue medication as directed Vyvanse 70 mg every morning Vyvanse 30 mg for weekends RX for above e-scribed and sent to pharmacy on record  [id number], [place] - [address] [place] [address] Phone: [phone] Fax: [phone]  Counseled regarding obtaining refills by calling pharmacy first to use automated refill request then if needed, call our office leaving a detailed message on the refill line.  Counseled medication administration, effects, and possible side effects.  ADHD medications discussed to include different medications and pharmacologic properties of each. Recommendation for specific medication to include dose, administration, expected effects, possible side effects and the risk to benefit ratio of medication management.  Advised importance of:  Good sleep hygiene (8- 10 hours per night)  Limited screen time (none on school nights, no more than 2 hours on  weekends)  Regular exercise(outside and active play)  Healthy eating (drink water, no sodas/sweet tea)  Regular family meals have been linked to lower levels of adolescent risk-taking behavior.   Adolescents who frequently eat meals with their family are less likely to engage in risk behaviors than those who never or rarely eat with their families.  So it is never too early to start this tradition.  Counseling at this visit included the review of old records and/or current chart.   Counseling included the following discussion points presented at every visit to improve understanding and treatment compliance.  Recent health history and today's examination Growth and development with anticipatory guidance provided regarding brain growth, executive function maturation and pre or pubertal development. School progress and continued advocay for appropriate accommodations to include maintain Structure, routine, organization, reward, motivation and consequences.  Additionally the patient was counseled to take medication while driving.       NEXT APPOINTMENT:  Return in about 6 months (around 03/06/2021) for Medication Check. Please call the office for a sooner appointment if problems arise.  Medical Decision-making: More than 50% of the appointment was spent counseling and discussing diagnosis and management of symptoms with the parent/patient.  I discussed the assessment and treatment plan with the parent. The parent/patient was provided an opportunity to ask questions and all were answered. The parent/patient agreed with the plan and demonstrated an understanding of the instructions.   The parent/patient was advised to call back or seek an in-person evaluation if the symptoms worsen or if the condition fails to improve as anticipated.  I provided 25 minutes of non-face-to-face time during this encounter.   Completed record review for 0 minutes prior to the virtual video visit.   Bruce Penna, NP  Counseling Time: 25 minutes   Total Contact Time: 25 minutes

## 2020-09-26 ENCOUNTER — Telehealth: Payer: Self-pay | Admitting: Pediatrics

## 2020-09-26 MED ORDER — LISDEXAMFETAMINE DIMESYLATE 70 MG PO CAPS
70.0000 mg | ORAL_CAPSULE | Freq: Every day | ORAL | 0 refills | Status: DC
Start: 2020-09-26 — End: 2020-10-29

## 2020-09-26 NOTE — Telephone Encounter (Signed)
Mom called for refill for Vyvanse 70 mg.  Patient last seen 09/05/20, next appointment 12/13/20.  [facility] [address] [facility] [place].

## 2020-09-26 NOTE — Telephone Encounter (Signed)
RX for above e-scribed and sent to pharmacy on record  [id number] [place], [place] - [address] [place] [address] Phone: [phone] Fax: [phone]   

## 2020-09-30 ENCOUNTER — Ambulatory Visit (INDEPENDENT_AMBULATORY_CARE_PROVIDER_SITE_OTHER): Payer: Self-pay

## 2020-09-30 ENCOUNTER — Encounter: Payer: Self-pay | Admitting: Podiatry

## 2020-09-30 ENCOUNTER — Other Ambulatory Visit: Payer: Self-pay

## 2020-09-30 ENCOUNTER — Ambulatory Visit (INDEPENDENT_AMBULATORY_CARE_PROVIDER_SITE_OTHER): Payer: BC Managed Care – PPO | Admitting: Podiatry

## 2020-09-30 ENCOUNTER — Telehealth: Payer: BC Managed Care – PPO | Admitting: Pediatrics

## 2020-09-30 VITALS — BP 122/75 | HR 68 | Temp 98.1°F

## 2020-09-30 DIAGNOSIS — S92425A Nondisplaced fracture of distal phalanx of left great toe, initial encounter for closed fracture: Secondary | ICD-10-CM | POA: Diagnosis not present

## 2020-09-30 DIAGNOSIS — S99922A Unspecified injury of left foot, initial encounter: Secondary | ICD-10-CM

## 2020-09-30 NOTE — Progress Notes (Signed)
   HPI: 20 y.o. male presenting today for evaluation of a fracture that was diagnosed by the urgent care to the left great toe.  DOI: 09/19/2020.  Patient states that a piano fell on the left great toe.  He went to the urgent care and was diagnosed with a toe fracture and referred here for follow-up treatment evaluation.  Patient presents today wearing a cam boot.  Past Medical History:  Diagnosis Date  . ADHD (attention deficit hyperactivity disorder)   . ADHD (attention deficit hyperactivity disorder), combined type 01/14/2016  . Dysgraphia 01/14/2016     Physical Exam: General: The patient is alert and oriented x3 in no acute distress.  Dermatology: Skin is warm, dry and supple bilateral lower extremities. Negative for open lesions or macerations.  Subungual hematoma noted to the left hallux nail plate  Vascular: Palpable pedal pulses bilaterally. No edema or erythema noted. Capillary refill within normal limits.  Neurological: Epicritic and protective threshold grossly intact bilaterally.   Musculoskeletal Exam: Range of motion within normal limits to all pedal and ankle joints bilateral. Muscle strength 5/5 in all groups bilateral.  There is some moderate edema noted to the left hallux  Radiographic Exam:  Normal osseous mineralization.  Oblique fracture of the distal phalanx left hallux as well as a small avulsion fracture at the base of the distal phalanx, closed, nondisplaced  Assessment: 1.  Fracture distal phalanx left hallux, closed, nondisplaced, initial encounter   Plan of Care:  1. Patient evaluated. X-Rays reviewed.  2.  Note for work provided today.  No work x1 week.  Light duty only x6 weeks 3.  Continue cam boot weightbearing as tolerated 4.  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 Felecia Shelling, DPM    [address] [place], [place] [address]                Office [phone]  Fax [phone]

## 2020-10-04 ENCOUNTER — Encounter: Payer: BC Managed Care – PPO | Admitting: Pediatrics

## 2020-10-29 ENCOUNTER — Other Ambulatory Visit: Payer: Self-pay

## 2020-10-29 MED ORDER — LISDEXAMFETAMINE DIMESYLATE 30 MG PO CAPS
30.0000 mg | ORAL_CAPSULE | Freq: Every day | ORAL | 0 refills | Status: DC
Start: 2020-10-29 — End: 2020-12-19

## 2020-10-29 MED ORDER — LISDEXAMFETAMINE DIMESYLATE 70 MG PO CAPS
70.0000 mg | ORAL_CAPSULE | Freq: Every day | ORAL | 0 refills | Status: DC
Start: 2020-10-29 — End: 2020-12-19

## 2020-10-29 NOTE — Telephone Encounter (Signed)
Last visit 09/05/2020 next visit 12/13/2020

## 2020-10-29 NOTE — Telephone Encounter (Signed)
RX for above e-scribed and sent to pharmacy on record  [id number] [place], [place] - [address] [place] [address] Phone: [phone] Fax: [phone]   

## 2020-11-11 ENCOUNTER — Other Ambulatory Visit: Payer: Self-pay

## 2020-11-11 ENCOUNTER — Ambulatory Visit (INDEPENDENT_AMBULATORY_CARE_PROVIDER_SITE_OTHER): Payer: BC Managed Care – PPO | Admitting: Podiatry

## 2020-11-11 ENCOUNTER — Ambulatory Visit (INDEPENDENT_AMBULATORY_CARE_PROVIDER_SITE_OTHER): Payer: BC Managed Care – PPO

## 2020-11-11 DIAGNOSIS — S92425A Nondisplaced fracture of distal phalanx of left great toe, initial encounter for closed fracture: Secondary | ICD-10-CM | POA: Diagnosis not present

## 2020-11-11 DIAGNOSIS — M84375A Stress fracture, left foot, initial encounter for fracture: Secondary | ICD-10-CM | POA: Diagnosis not present

## 2020-11-11 DIAGNOSIS — S99922A Unspecified injury of left foot, initial encounter: Secondary | ICD-10-CM

## 2020-11-11 NOTE — Progress Notes (Signed)
   HPI: 20 y.o. male presenting today for evaluation of a fracture that was diagnosed by the urgent care to the left great toe.  DOI: 09/19/2020.  Patient states that a piano fell on the left great toe.  He went to the urgent care and was diagnosed with a toe fracture and referred here for follow-up treatment evaluation.    Today the patient states that he is feeling very well.  He has been very active most recently over the last few weeks with no pain.  He does have some occasional intermittent tenderness to the area.  Otherwise no new complaints at this time  Past Medical History:  Diagnosis Date  . ADHD (attention deficit hyperactivity disorder)   . ADHD (attention deficit hyperactivity disorder), combined type 01/14/2016  . Dysgraphia 01/14/2016     Physical Exam: General: The patient is alert and oriented x3 in no acute distress.  Dermatology: Skin is warm, dry and supple bilateral lower extremities. Negative for open lesions or macerations.  Subungual hematoma noted to the left hallux nail plate  Vascular: Palpable pedal pulses bilaterally. No edema or erythema noted. Capillary refill within normal limits.  Neurological: Epicritic and protective threshold grossly intact bilaterally.   Musculoskeletal Exam: Range of motion within normal limits to all pedal and ankle joints bilateral. Muscle strength 5/5 in all groups bilateral.  There is some moderate edema noted to the left hallux  Radiographic Exam:  Normal osseous mineralization.  Oblique fracture of the distal phalanx left hallux as well as a small avulsion fracture at the base of the distal phalanx, closed, nondisplaced appears to demonstrate routine healing.  No significant change since last x-rays taken 09/30/2020  Assessment: 1.  Fracture distal phalanx left hallux, closed, nondisplaced, subsequent encounter encounter, with routine healing   Plan of Care:  1. Patient evaluated. X-Rays reviewed.  2.  Patient may now resume  full activity no restrictions 3.  Recommend good supportive shoe gear 4.  Return to clinic as needed  *Patient works Psychologist, clinical for the school system      Felecia Shelling, DPM [facility]  Dr. Felecia Shelling, DPM    [address] [place], [place] [address]                Office [phone]  Fax [phone]

## 2020-11-15 ENCOUNTER — Other Ambulatory Visit: Payer: Self-pay | Admitting: Podiatry

## 2020-11-15 DIAGNOSIS — M84375A Stress fracture, left foot, initial encounter for fracture: Secondary | ICD-10-CM

## 2020-12-13 ENCOUNTER — Other Ambulatory Visit: Payer: Self-pay

## 2020-12-13 ENCOUNTER — Telehealth (INDEPENDENT_AMBULATORY_CARE_PROVIDER_SITE_OTHER): Payer: BC Managed Care – PPO | Admitting: Pediatrics

## 2020-12-13 ENCOUNTER — Encounter: Payer: Self-pay | Admitting: Pediatrics

## 2020-12-13 DIAGNOSIS — F902 Attention-deficit hyperactivity disorder, combined type: Secondary | ICD-10-CM

## 2020-12-13 DIAGNOSIS — Z719 Counseling, unspecified: Secondary | ICD-10-CM | POA: Diagnosis not present

## 2020-12-13 DIAGNOSIS — Z79899 Other long term (current) drug therapy: Secondary | ICD-10-CM

## 2020-12-13 DIAGNOSIS — R278 Other lack of coordination: Secondary | ICD-10-CM

## 2020-12-13 NOTE — Progress Notes (Signed)
[name] [address], [place]. [address] [place] [address] Dept: [phone] Dept Fax: [phone]  Medication Check by Caregility due to COVID-19  Patient ID:  Bruce Barton  male DOB: [birth year]   19 y.o.   MRN: [id number]   DATE:12/13/20  PCP: Bruce Hoit, MD  Interviewed: Bruce Barton  Location: Work site, outside, no others present Provider location: [facility] office  Virtual Visit via Video Note Connected with Bruce Barton on 12/13/20 at  3:30 PM EDT by video enabled telemedicine application and verified that I am speaking with the correct person using two identifiers.     I discussed the limitations, risks, security and privacy concerns of performing an evaluation and management service by telephone and the availability of in person appointments. I also discussed with the parent/patient that there may be a patient responsible charge related to this service. The parent/patient expressed understanding and agreed to proceed.  HISTORY OF PRESENT ILLNESS/CURRENT STATUS: Bruce Barton is being followed for medication management for ADHD, dysgraphia and learning differences.   Last visit on 09/13/20  Bruce Barton currently prescribed Vyvanse 70 mg every morning    Behaviors: doing well at home and work.  Works for [employer] doing [employer] well (eating breakfast, lunch and dinner).  Elimination: No concerns Sleeping: Sleeping through the night.   MEDICAL HISTORY: Individual Medical History/ Review of Systems: Changes? :No  Family Medical/ Social History: Changes? No   Patient Lives with: mother and father  MENTAL HEALTH: No concerns  ASSESSMENT:  Bruce Barton is a 20 year old adult with ADHD and dysgraphia that is well controlled with current medication. DIAGNOSES:    ICD-10-CM   1. Attention deficit hyperactivity disorder (ADHD), combined type  F90.2   2. Dysgraphia  R27.8   3. Patient  counseled  Z71.9   4. Medication management  Z79.899      RECOMMENDATIONS:  Patient Instructions  DISCUSSION: Counseled regarding the following coordination of care items:  Continue medication as directed Vyvanse 70 mg every morning - work days Vyvanse 30 mg on weekends, holidays RX for above e-scribed and sent to pharmacy on record  [id number], [place] - [address] [place] [address] Phone: [phone] Fax: [phone]   Counseled regarding obtaining refills by calling pharmacy first to use automated refill request then if needed, call our office leaving a detailed message on the refill line.  Counseled medication administration, effects, and possible side effects.  ADHD medications discussed to include different medications and pharmacologic properties of each. Recommendation for specific medication to include dose, administration, expected effects, possible side effects and the risk to benefit ratio of medication management.  Advised importance of:  Good sleep hygiene (8- 10 hours per night) Limited screen time (none on school nights, no more than 2 hours on weekends) Regular exercise(outside and active play) Healthy eating (drink water, no sodas/sweet tea)  Counseling at this visit included the review of old records and/or current chart.   Counseling included the following discussion points presented at every visit to improve understanding and treatment compliance.  Recent health history and today's examination Growth and development with anticipatory guidance provided regarding brain growth, executive function maturation and pre or pubertal development.  Additionally the patient was counseled to take medication while driving.          NEXT APPOINTMENT:  Return in about 6 months (around 06/15/2021) for Medication Check. Please call the office for  a sooner appointment if problems arise.  Medical Decision-making:  I  spent 15 minutes dedicated to the care of this patient on the date of this encounter to include face to face time with the patient and/or parent reviewing medical records and documentation by teachers, performing and discussing the assessment and treatment plan, reviewing and explaining completed speciality labs and obtaining specialty lab samples.  The patient and/or parent was provided an opportunity to ask questions and all were answered. The patient and/or parent agreed with the plan and demonstrated an understanding of the instructions.   The patient and/or parent was advised to call back or seek an in-person evaluation if the symptoms worsen or if the condition fails to improve as anticipated.  I provided 15 minutes of non-face-to-face time during this encounter.   Completed record review for 0 minutes prior to and after the virtual visit.   Counseling Time: 15 minutes   Total Contact Time: 15 minutes

## 2020-12-13 NOTE — Patient Instructions (Signed)
DISCUSSION: Counseled regarding the following coordination of care items:  Continue medication as directed Vyvanse 70 mg every morning - work days Vyvanse 30 mg on weekends, holidays RX for above e-scribed and sent to pharmacy on record  [id number], [place] - [address] [place] [address] Phone: [phone] Fax: [phone]   Counseled regarding obtaining refills by calling pharmacy first to use automated refill request then if needed, call our office leaving a detailed message on the refill line.  Counseled medication administration, effects, and possible side effects.  ADHD medications discussed to include different medications and pharmacologic properties of each. Recommendation for specific medication to include dose, administration, expected effects, possible side effects and the risk to benefit ratio of medication management.  Advised importance of:  Good sleep hygiene (8- 10 hours per night) Limited screen time (none on school nights, no more than 2 hours on weekends) Regular exercise(outside and active play) Healthy eating (drink water, no sodas/sweet tea)  Counseling at this visit included the review of old records and/or current chart.   Counseling included the following discussion points presented at every visit to improve understanding and treatment compliance.  Recent health history and today's examination Growth and development with anticipatory guidance provided regarding brain growth, executive function maturation and pre or pubertal development.  Additionally the patient was counseled to take medication while driving.

## 2020-12-19 ENCOUNTER — Other Ambulatory Visit: Payer: Self-pay

## 2020-12-19 NOTE — Telephone Encounter (Signed)
Last visit 12/13/2020

## 2020-12-20 MED ORDER — LISDEXAMFETAMINE DIMESYLATE 30 MG PO CAPS: 30.0000 mg | ORAL_CAPSULE | Freq: Every day | ORAL | 0 refills | Status: DC

## 2020-12-20 MED ORDER — LISDEXAMFETAMINE DIMESYLATE 70 MG PO CAPS
70.0000 mg | ORAL_CAPSULE | Freq: Every day | ORAL | 0 refills | Status: DC
Start: 2020-12-20 — End: 2021-01-22

## 2020-12-20 NOTE — Telephone Encounter (Signed)
RX for above e-scribed and sent to pharmacy on record  [id number] [place], [place] - [address] [place] [address] Phone: [phone] Fax: [phone]   

## 2021-01-22 ENCOUNTER — Other Ambulatory Visit: Payer: Self-pay

## 2021-01-22 MED ORDER — LISDEXAMFETAMINE DIMESYLATE 30 MG PO CAPS: 30.0000 mg | ORAL_CAPSULE | Freq: Every day | ORAL | 0 refills | Status: DC

## 2021-01-22 MED ORDER — LISDEXAMFETAMINE DIMESYLATE 70 MG PO CAPS
70.0000 mg | ORAL_CAPSULE | Freq: Every day | ORAL | 0 refills | Status: DC
Start: 2021-01-22 — End: 2021-02-28

## 2021-01-22 NOTE — Telephone Encounter (Signed)
RX for above e-scribed and sent to pharmacy on record  [id number] [place], [place] - [address] [place] [address] Phone: [phone] Fax: [phone]   

## 2021-01-22 NOTE — Telephone Encounter (Signed)
Last visit 12/13/2020

## 2021-02-28 ENCOUNTER — Other Ambulatory Visit: Payer: Self-pay

## 2021-02-28 MED ORDER — LISDEXAMFETAMINE DIMESYLATE 70 MG PO CAPS
70.0000 mg | ORAL_CAPSULE | Freq: Every day | ORAL | 0 refills | Status: DC
Start: 2021-02-28 — End: 2021-04-08

## 2021-02-28 MED ORDER — LISDEXAMFETAMINE DIMESYLATE 30 MG PO CAPS
30.0000 mg | ORAL_CAPSULE | Freq: Every day | ORAL | 0 refills | Status: DC
Start: 2021-02-28 — End: 2021-05-07

## 2021-02-28 NOTE — Telephone Encounter (Signed)
RX for above e-scribed and sent to pharmacy on record  [id number] [place], [place] - [address] [place] [address] Phone: [phone] Fax: [phone]   

## 2021-03-24 ENCOUNTER — Other Ambulatory Visit: Payer: Self-pay

## 2021-03-24 ENCOUNTER — Encounter: Payer: BC Managed Care – PPO | Admitting: Pediatrics

## 2021-04-08 ENCOUNTER — Other Ambulatory Visit: Payer: Self-pay

## 2021-04-09 MED ORDER — LISDEXAMFETAMINE DIMESYLATE 70 MG PO CAPS
70.0000 mg | ORAL_CAPSULE | Freq: Every day | ORAL | 0 refills | Status: DC
Start: 2021-04-09 — End: 2021-05-07

## 2021-04-09 NOTE — Telephone Encounter (Signed)
RX for above e-scribed and sent to pharmacy on record  [id number] [place], [place] - [address] [place] [address] Phone: [phone] Fax: [phone]   

## 2021-04-25 ENCOUNTER — Encounter: Payer: Self-pay | Admitting: Pediatrics

## 2021-04-25 ENCOUNTER — Ambulatory Visit: Payer: BC Managed Care – PPO | Admitting: Pediatrics

## 2021-04-25 ENCOUNTER — Other Ambulatory Visit: Payer: Self-pay

## 2021-04-25 VITALS — Ht 68.0 in | Wt 182.0 lb

## 2021-04-25 DIAGNOSIS — Z7189 Other specified counseling: Secondary | ICD-10-CM

## 2021-04-25 DIAGNOSIS — Z79899 Other long term (current) drug therapy: Secondary | ICD-10-CM

## 2021-04-25 DIAGNOSIS — R278 Other lack of coordination: Secondary | ICD-10-CM

## 2021-04-25 DIAGNOSIS — F902 Attention-deficit hyperactivity disorder, combined type: Secondary | ICD-10-CM

## 2021-04-25 DIAGNOSIS — Z719 Counseling, unspecified: Secondary | ICD-10-CM

## 2021-04-25 NOTE — Progress Notes (Signed)
Medication Check  Patient ID: Bruce Barton  DOB: [birth year]  MRN: [id number]  DATE:04/25/21 Bernadette Hoit, MD  Accompanied by:  Self Patient Lives with: mother and father Brother out of home and married  HISTORY/CURRENT STATUS: Chief Complaint - Polite and cooperative and present for medical follow up for medication management of ADHD, dysgraphia and learning differences. Last follow up 12/03/20 by video and last in person 05/03/2020. Currently Vyvanse 70 mg every morning and will use Vyvanse 30 mg as needed when out of medication.  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  No Sleep concerns.  Elimination: no concerns  Individual Medical History/ Review of Systems: Changes? :No  Family Medical/ Social History: Changes? No  MENTAL HEALTH: Wants counseling for some anger issues Providers listed and discussed.  PHYSICAL EXAM; Vitals:   04/25/21 1506  Weight: 182 lb (82.6 kg)  Height: 5\' 8"  (1.727 m)   Body mass index is 27.67 kg/m.  General Physical Exam: Unchanged from previous exam, date:05/03/20 Weight reduction improving BMI    ASSESSMENT:  Bruce Barton is a 20 year old with a diagnosis of ADHD/dysgraphia that is improved and well controlled with current medication.  He will be turning 20 in November and will aged out of my practice.  We will transition care to the family nurse practitioner in this office. He discussed feeling more angry with frustration intolerance and has requested recommendations for counseling.  List provided and I do recommend starting with family solutions of  [place]. We discussed and applauded the excellent effort to reduce his cigarette smoking and his continue to attempt to reduce vaping. I do recommend continued screen time reduction as well as the reduction of social media that causes feelings of social isolation and inadequacy.  Continue with good physical active work as well as maintaining good sleep hygiene.  Continue with good caloric reduction improving protein avoiding junk food and empty calories. Follow-up in 6 months with transition of care to family nurse practitioner. DIAGNOSES:    ICD-10-CM   1. Attention deficit hyperactivity disorder (ADHD), combined type  F90.2     2. Dysgraphia  R27.8     3. Medication management  Z79.899     4. Patient counseled  Z71.9     5. Parenting dynamics counseling  Z71.89       RECOMMENDATIONS:  Patient Instructions  DISCUSSION: Counseled regarding the following coordination of care items:  Continue medication as directed Vyvanse 70 mg every morning Vyvanse 30 mg as needed RX for above e-scribed and sent to pharmacy on record  [id number], [place] - [address] [place] Phone: [phone] Fax: [phone]  Advised importance of:  Sleep Maintain good sleep schedules and routines Limited screen time (none on school nights, no more than 2 hours on weekends) Always reduce screen time and nonsense social media Regular exercise(outside and active play) Good physical activities Healthy eating (drink water, no sodas/sweet tea) Stay hydrated, drink water and avoid junk food and empty salty calories  Wants to start therapy for anger and frustrations.  Parent/teen counseling is recommended and may include Family counseling.  Consider the following options:  [facility]  http://famsolutions.org/ [phone]  [facility]  http://www.youthfocus.org/home.html 336 616-879-1359  Additional resources: COUNSELING  AGENCIES in [place] (Accepting Medicaid)  [phone] service coordination hub Provides information on mental health, intellectual/developmental disabilities & substance abuse services in [facility] [address] [place].  "[facility]"           [phone] [facility] Counsel[facility] & Coaching Center [address] [place]          [phone] [facility] [address] [place].            [phone]  [facility] [address]            [phone]  [facility] Care Services [address]           [phone] [facility] [address] [phone]  [facility] Behavioral Health Services:   [place] [phone]; Kathryne Sharper [phone] [phone]  [facility] [address] [place].  "[facility]"    [phone]  [facility] Counseling & Coaching Center [address] [place]          [phone]  [facility] [address] [place].    [phone]   [facility] [address]      [phone]   [facility] Care Services [address]    [phone]  [facility] [address] [phone]  [facility] Behavioral Health - [phone]  [facility] [address] [place]  [phone]   [facility] [address] [place].        [phone]  [facility] (SEL) [address] [place]. [phone]  [facility] of Care [address] [place]  [phone]  [facility] Behavioral Health Services [phone]  [facility] [phone]  [facility] [phone]  [facility] - [phone]  [place] [facility] - [phone]  [facility] of Life Counseling [phone]  [facility] - [phone]  Walker Shadow PhD [phone]  Melinda Crutch  [place] [phone]    Patient verbalized understanding of all topics discussed.  NEXT APPOINTMENT:  Return in about 6 months (around 10/26/2021) for Medical Follow up.  Disclaimer: This documentation was generated through the use of dictation and/or voice recognition software, and as such, may contain spelling or other transcription errors. Please disregard any inconsequential errors.  Any questions regarding the content of this documentation should be directed to the individual who electronically signed.

## 2021-04-25 NOTE — Patient Instructions (Addendum)
DISCUSSION: Counseled regarding the following coordination of care items:  Continue medication as directed Vyvanse 70 mg every morning Vyvanse 30 mg as needed RX for above e-scribed and sent to pharmacy on record  [id number], [place] - [address] [place] [address] Phone: [phone] Fax: [phone]  Advised importance of:  Sleep Maintain good sleep schedules and routines Limited screen time (none on school nights, no more than 2 hours on weekends) Always reduce screen time and nonsense social media Regular exercise(outside and active play) Good physical activities Healthy eating (drink water, no sodas/sweet tea) Stay hydrated, drink water and avoid junk food and empty salty calories  Wants to start therapy for anger and frustrations.  Parent/teen counseling is recommended and may include Family counseling.  Consider the following options: [facility]  http://famsolutions.org/ [phone]  [facility]  http://www.youthfocus.org/home.html 336 989-258-2244  Additional resources: COUNSELING AGENCIES in [place] (Accepting Medicaid)  [phone] service coordination hub Provides information on mental health, intellectual/developmental disabilities & substance abuse services in [facility] [address].  "[facility]"           [phone] [facility] Counseling & Coaching Center [address] [place]          [phone] [facility] Counseling [address] [place].            [phone]  [facility] [address]            [phone]  [facility] Care Services [address]           [phone] [facility] [address] [phone]  [facility] Behavioral Health Services:   [place] [phone]; Kathryne Sharper [phone] [phone]  [facility] [address] [place].  "[facility]"    [phone]  [facility] Counseling & Coaching Center [address] [place]          [phone]  [facility] Counseling [address] [place].    [phone]   [facility] [address]      [phone]   [facility] Care Services [address]    [phone]  [facility] [phone]  [facility] Behavioral Health - [phone]  [facility] [place] [address] [place]  [phone]   [facility] [address] [place].        [phone]  [facility] (SEL) [address] [place]. [phone]  [facility] of Care [address]  [phone]  [facility] Behavioral Health Services [phone]  [facility] [phone]  [facility] [phone]  [facility] - [phone]  [place] [facility] - [phone]  [facility] Life Counseling [phone]  [facility] - [phone]  Walker Shadow PhD [phone]  [place] [phone]

## 2021-04-28 HISTORY — PX: WISDOM TOOTH EXTRACTION: SHX21

## 2021-05-07 ENCOUNTER — Other Ambulatory Visit: Payer: Self-pay

## 2021-05-07 MED ORDER — LISDEXAMFETAMINE DIMESYLATE 30 MG PO CAPS: 30.0000 mg | ORAL_CAPSULE | Freq: Every day | ORAL | 0 refills | Status: DC

## 2021-05-07 MED ORDER — LISDEXAMFETAMINE DIMESYLATE 70 MG PO CAPS: 70.0000 mg | ORAL_CAPSULE | Freq: Every day | ORAL | 0 refills | Status: DC

## 2021-05-07 NOTE — Telephone Encounter (Signed)
E-Prescribed Vyvanse 70 and 30 directly to  [id number] [place] - [address] [place] [address] Phone: [phone] Fax: [phone]

## 2021-06-11 ENCOUNTER — Other Ambulatory Visit: Payer: Self-pay

## 2021-06-11 MED ORDER — LISDEXAMFETAMINE DIMESYLATE 70 MG PO CAPS: 70.0000 mg | ORAL_CAPSULE | Freq: Every day | ORAL | 0 refills | Status: DC

## 2021-06-11 MED ORDER — LISDEXAMFETAMINE DIMESYLATE 30 MG PO CAPS
30.0000 mg | ORAL_CAPSULE | Freq: Every day | ORAL | 0 refills | Status: DC
Start: 2021-06-11 — End: 2021-07-09

## 2021-06-11 NOTE — Telephone Encounter (Signed)
E-Prescribed Vyvanse 30 + 70 directly to  [id number], [place] - [address] [place] [address] Phone: [phone] Fax: [phone]

## 2021-07-09 ENCOUNTER — Other Ambulatory Visit: Payer: Self-pay | Admitting: Pediatrics

## 2021-07-09 MED ORDER — LISDEXAMFETAMINE DIMESYLATE 30 MG PO CAPS: 30.0000 mg | ORAL_CAPSULE | Freq: Every day | ORAL | 0 refills | Status: DC

## 2021-07-09 MED ORDER — LISDEXAMFETAMINE DIMESYLATE 70 MG PO CAPS: 70.0000 mg | ORAL_CAPSULE | Freq: Every day | ORAL | 0 refills | Status: DC

## 2021-08-18 ENCOUNTER — Other Ambulatory Visit: Payer: Self-pay

## 2021-08-18 MED ORDER — LISDEXAMFETAMINE DIMESYLATE 70 MG PO CAPS: 70.0000 mg | ORAL_CAPSULE | Freq: Every day | ORAL | 0 refills | Status: DC

## 2021-08-18 MED ORDER — LISDEXAMFETAMINE DIMESYLATE 30 MG PO CAPS: 30.0000 mg | ORAL_CAPSULE | Freq: Every day | ORAL | 0 refills | Status: DC

## 2021-08-18 NOTE — Telephone Encounter (Signed)
RX for above e-scribed and sent to pharmacy on record  [id number] [place] - [address] [place] [address] Phone: [phone] Fax: [phone]   

## 2021-09-12 ENCOUNTER — Other Ambulatory Visit: Payer: Self-pay

## 2021-09-15 ENCOUNTER — Encounter: Payer: Self-pay | Admitting: Family Medicine

## 2021-09-15 ENCOUNTER — Other Ambulatory Visit: Payer: Self-pay

## 2021-09-15 ENCOUNTER — Ambulatory Visit: Payer: BC Managed Care – PPO | Admitting: Family Medicine

## 2021-09-15 VITALS — BP 118/76 | HR 100 | Temp 97.6°F | Ht 69.0 in | Wt 213.6 lb

## 2021-09-15 DIAGNOSIS — F902 Attention-deficit hyperactivity disorder, combined type: Secondary | ICD-10-CM | POA: Diagnosis not present

## 2021-09-15 DIAGNOSIS — Z1159 Encounter for screening for other viral diseases: Secondary | ICD-10-CM | POA: Diagnosis not present

## 2021-09-15 DIAGNOSIS — Z13220 Encounter for screening for lipoid disorders: Secondary | ICD-10-CM

## 2021-09-15 DIAGNOSIS — Z23 Encounter for immunization: Secondary | ICD-10-CM

## 2021-09-15 DIAGNOSIS — Z114 Encounter for screening for human immunodeficiency virus [HIV]: Secondary | ICD-10-CM | POA: Diagnosis not present

## 2021-09-15 DIAGNOSIS — F418 Other specified anxiety disorders: Secondary | ICD-10-CM

## 2021-09-15 LAB — LIPID PANEL
Cholesterol: 165 mg/dL (ref 0–200)
HDL: 44.8 mg/dL (ref >39.00)
LDL Cholesterol: 106 mg/dL — ABNORMAL HIGH (ref 0–99)
NonHDL: 120.6
Total CHOL/HDL Ratio: 4
Triglycerides: 74 mg/dL (ref 0.0–149.0)
VLDL: 14.8 mg/dL (ref 0.0–40.0)

## 2021-09-15 MED ORDER — LISDEXAMFETAMINE DIMESYLATE 70 MG PO CAPS: 70.0000 mg | ORAL_CAPSULE | Freq: Every day | ORAL | 0 refills | Status: DC

## 2021-09-15 NOTE — Progress Notes (Signed)
[facility] [address] [place] [address] Dept: [phone] Dept Fax: [phone]  New Patient Office Visit  Subjective:    Patient ID: Bruce Barton, male    DOB: 2001-07-02, 20 y.o..   MRN: [id number]  Chief Complaint  Patient presents with   Establish Care    NP-establish care. No concerns.  Wants flu shot today.       History of Present Illness:  Patient is in today to establish care. Bruce Barton was born in [place]. He grew up in [place]. He attended [employer]. He took classes  at [employer] for 6 months, but then quit when the COVID pandemic started. 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 He is single and has no children. Bruce Barton vapes, using an e-cigarette over about a 2 week time period. He is considering stopping this. He drinks alcohol socially. He denies drug use.  Bruce Barton has a history of ADHD diagnosed when he was in elementary school. He Had been managed by a pediatric psychiatrist, but now is transitioning his care to me. He was initially managed on a patch. By high school, he was transitioned to Ritalin, but then developed anger issues. He then was transitioned to Vyvanse, which he remains on. Bruce Barton also has a history of depression and anxiety. He sees a counselor Hulda Humphrey, [facility], [facility]) weekly.  Past Medical History: Patient Active Problem List   Diagnosis Date Noted   Anxiety with depression 09/15/2021   ADHD (attention deficit hyperactivity disorder) 01/14/2016   Past Surgical History:  Procedure Laterality Date   WISDOM TOOTH EXTRACTION  04/2021   Family History  Problem Relation Age of Onset   Hypertension Mother    Diabetes Paternal Grandmother    Heart disease Paternal Grandfather    Outpatient Medications Prior to Visit  Medication Sig Dispense Refill   lisdexamfetamine (VYVANSE) 30 MG capsule  Take 1 capsule (30 mg total) by mouth daily. weekends 30 capsule 0   lisdexamfetamine (VYVANSE) 70 MG capsule Take 1 capsule (70 mg total) by mouth daily with breakfast. 30 capsule 0   No facility-administered medications prior to visit.   Allergies  Allergen Reactions   Amoxicillin Er Hives   Objective:   Today's Vitals   09/15/21 0817  BP: 118/76  Pulse: 100  Temp: 97.6 F (36.4 C)  TempSrc: Temporal  SpO2: 98%  Weight: 213 lb 9.6 oz (96.9 kg)  Height: 5\' 9"  (1.753 m)   Body mass index is 31.54 kg/m.   General: Well developed, well nourished. No acute distress. Psych: Alert and oriented. Normal mood and affect.  Health Maintenance Due  Topic Date Due   Pneumococcal Vaccine 26-21 Years old (1 - PPSV23 if available, else PCV20) 11/03/2001   HPV VACCINES (1 - Male 2-dose series) Never done   Hepatitis C Screening  Never done   COVID-19 Vaccine (3 - Pfizer risk series) 07/23/2020   INFLUENZA VACCINE  Never done     Assessment & Plan:   1. Attention deficit hyperactivity disorder (ADHD), combined type I will plan to continue Vyvanse. He notes he ahd a prescription for 30 mg strength that he would use when he ran out of the 70 mg strength to bridge until he got more medication. I discussed how he can use MyChart to reach out and hopefully prevent gaps in therapy. I will want to see him again in 3 months.  -  lisdexamfetamine (VYVANSE) 70 MG capsule; Take 1 capsule (70 mg total) by mouth daily with breakfast.  Dispense: 30 capsule; Refill: 0  2. Anxiety with depression His PHQ-9 and GAD7 support that he has some anxiety with depression. At the current time he will continue counseling for this. He notes that a trigger for him has been living in his older brother's shadow.  3. Screening for HIV (human immunodeficiency virus)  - HIV Antibody (routine testing w rflx)  4. Encounter for hepatitis C screening test for low risk patient  - HCV Ab w Reflex to Quant PCR  5.  Screening for lipid disorders  - Lipid panel  6. Need for influenza vaccination  - Flu Vaccine QUAD 6+ mos PF IM (Fluarix Quad PF)  Loyola Mast, MD

## 2021-09-16 LAB — HCV AB W REFLEX TO QUANT PCR: HCV Ab: <0.1 s/co ratio (ref 0.0–0.9)

## 2021-09-16 LAB — HCV INTERPRETATION

## 2021-09-16 LAB — HIV ANTIBODY (ROUTINE TESTING W REFLEX): HIV 1&2 Ab, 4th Generation: NONREACTIVE

## 2021-10-24 ENCOUNTER — Other Ambulatory Visit: Payer: Self-pay

## 2021-10-24 ENCOUNTER — Encounter: Payer: Self-pay | Admitting: Family Medicine

## 2021-10-24 ENCOUNTER — Ambulatory Visit: Payer: BC Managed Care – PPO | Admitting: Family Medicine

## 2021-10-24 VITALS — BP 124/78 | HR 105 | Temp 98.2°F | Ht 69.0 in | Wt 204.4 lb

## 2021-10-24 DIAGNOSIS — J069 Acute upper respiratory infection, unspecified: Secondary | ICD-10-CM

## 2021-10-24 NOTE — Progress Notes (Signed)
°  [facility] [address] [place] [address] Dept: [phone] Dept Fax: [phone]  Office Visit  Subjective:    Patient ID: Bruce Barton, male    DOB: December 24, 2000, 21 y.o..   MRN: [id number]  Chief Complaint  Patient presents with   Acute Visit    C/o having sinus congestion, cough, fever x 3 days.  Fever went away yesterday.   He has taken taken Ibuprofen with some relief.       History of Present Illness:  Patient is in today for a 4-5 day history of illness. He notes this started on Monday with fatigue. Tuesday, he awakened with cough, fever, and rhinorrhea. He went to an Urgent Care. He was found to have a temp of 102.7 F. He had two COVID test, an influenza test, and a strep culture, all of which were negative. He notes his fever broke last night. He still have occasional episodes of nasla congesiton with rhinorrhea. He notes his mother was worried that he might have pneumonia.  Past Medical History: Patient Active Problem List   Diagnosis Date Noted   Anxiety with depression 09/15/2021   ADHD (attention deficit hyperactivity disorder) 01/14/2016   Past Surgical History:  Procedure Laterality Date   WISDOM TOOTH EXTRACTION  04/2021   Family History  Problem Relation Age of Onset   Hypertension Mother    Diabetes Paternal Grandmother    Heart disease Paternal Grandfather    Outpatient Medications Prior to Visit  Medication Sig Dispense Refill   lisdexamfetamine (VYVANSE) 70 MG capsule Take 1 capsule (70 mg total) by mouth daily with breakfast. 30 capsule 0   No facility-administered medications prior to visit.   Allergies  Allergen Reactions   Amoxicillin Er Hives     Objective:   Today's Vitals   10/24/21 1332  BP: 124/78  Pulse: (!) 105  Temp: 98.2 F (36.8 C)  TempSrc: Temporal  SpO2: 95%  Weight: 204 lb 6.4 oz (92.7 kg)  Height: 5\' 9"  (1.753 m)   Body mass index is 30.18 kg/m.    General: Well developed, well nourished. No acute distress. HEENT: Normocephalic, non-traumatic. Conjunctiva clear. External ears normal. EAC and TMs normal   bilaterally. Nose clear without congestion or rhinorrhea. Mucous membranes moist. Oropharynx clear.   Good dentition. Neck: Supple. No lymphadenopathy. No thyromegaly. Lungs: Clear to auscultation bilaterally. No wheezing, rales or rhonchi. CV: RRR without murmurs or rubs. Pulses 2+ bilaterally. Psych: Alert and oriented. Normal mood and affect.  Health Maintenance Due  Topic Date Due   HPV VACCINES (1 - Male 2-dose series) Never done   COVID-19 Vaccine (3 - Pfizer risk series) 07/23/2020      Assessment & Plan:   1. Viral URI Discussed home care for viral illness, including rest, pushing fluids, and OTC medications as needed for symptom relief. Follow-up if needed for worsening or persistent symptoms.  07/25/2020, MD

## 2021-10-27 ENCOUNTER — Other Ambulatory Visit: Payer: Self-pay | Admitting: Family Medicine

## 2021-10-27 DIAGNOSIS — F902 Attention-deficit hyperactivity disorder, combined type: Secondary | ICD-10-CM

## 2021-10-27 MED ORDER — LISDEXAMFETAMINE DIMESYLATE 70 MG PO CAPS: 70.0000 mg | ORAL_CAPSULE | Freq: Every day | ORAL | 0 refills | Status: DC

## 2021-10-27 NOTE — Telephone Encounter (Signed)
Pt mother called and said pt needs [facility] [address]

## 2021-10-27 NOTE — Telephone Encounter (Signed)
Refill request for: Vyvance 70 mg LR  09/15/21, #30, 0 rf LOV  09/15/21 FOV  12/15/21  Please review and advise.  Thanks.  Dm/cma

## 2021-10-28 NOTE — Telephone Encounter (Signed)
Lft detailed VM that RX was sent ot the pharmacy and to call back if any problem picking up the RX.  D.cm

## 2021-11-26 ENCOUNTER — Other Ambulatory Visit: Payer: Self-pay | Admitting: Family Medicine

## 2021-11-26 DIAGNOSIS — F902 Attention-deficit hyperactivity disorder, combined type: Secondary | ICD-10-CM

## 2021-11-26 MED ORDER — LISDEXAMFETAMINE DIMESYLATE 70 MG PO CAPS: 70.0000 mg | ORAL_CAPSULE | Freq: Every day | ORAL | 0 refills | Status: DC

## 2021-11-26 NOTE — Telephone Encounter (Signed)
Caller Name: Ras Kollman  ?Call back phone #: [phone] ? ?MEDICATION(S): Vyvanse 70mg  ? ? ?Days of Med Remaining: 2 ? ?Has the patient contacted their pharmacy (YES/NO)?  No its a controlled medication ?IF YES, when and what did the pharmacy advise?  ?IF NO, request that the patient contact the pharmacy for the refills in the future.  ?           The pharmacy will send an electronic request (except for controlled medications). ? ?[facility] [place] ? ?~~~Please advise patient/caregiver to allow 2-3 business days to process RX refills.  ?

## 2021-11-26 NOTE — Telephone Encounter (Signed)
Lft VM  that RX was sent to the pharmacy. Dm/cma  

## 2021-11-26 NOTE — Telephone Encounter (Signed)
Refill request for: ?Vyvance 70 mg ?LR 10/27/21, #30, 0 rf ?LOV 09/15/21 ?FOV  12/15/21 ? ?Please review and advise.  ?Thanks. Dm/cma ? ?

## 2021-12-15 ENCOUNTER — Other Ambulatory Visit: Payer: Self-pay

## 2021-12-15 ENCOUNTER — Encounter: Payer: Self-pay | Admitting: Family Medicine

## 2021-12-15 ENCOUNTER — Ambulatory Visit: Payer: BC Managed Care – PPO | Admitting: Family Medicine

## 2021-12-15 VITALS — BP 122/80 | HR 120 | Temp 98.5°F | Ht 69.0 in | Wt 210.0 lb

## 2021-12-15 DIAGNOSIS — F418 Other specified anxiety disorders: Secondary | ICD-10-CM

## 2021-12-15 DIAGNOSIS — F902 Attention-deficit hyperactivity disorder, combined type: Secondary | ICD-10-CM | POA: Diagnosis not present

## 2021-12-15 MED ORDER — LISDEXAMFETAMINE DIMESYLATE 70 MG PO CAPS: 70.0000 mg | ORAL_CAPSULE | Freq: Every day | ORAL | 0 refills | Status: DC

## 2021-12-15 NOTE — Progress Notes (Signed)
?  [facility] ?[address] ?[place] [address] ?Dept: [phone] ?Dept Fax: [phone] ? ?Chronic Care Office Visit ? ?Subjective:  ? ? Patient ID: Bruce Barton, male    DOB: Feb 01, 2001, 21 y.o..   MRN: [id number] ? ?Chief Complaint  ?Patient presents with  ? Follow-up  ?  3 month f/u meds.   No concerns.    ? ? ?History of Present Illness: ? ?Patient is in today for reassessment of chronic medical issues. ? ?Mr. Pintor has a history of ADHD diagnosed when he was in elementary school. He is currently managed on Vyvanse. He finds this is helping him be successful at work. He notes improved focus and task completion. He is sleeping adequately and notes no problems with his appetite. ? ?Mr. Prather also has a history of depression and anxiety. He sees a counselor Hulda Humphrey, [facility], [facility]) weekly. He feels his relationships with his family haved significantly. He discuss setting boundaries with his father, which he has seen to improve their relationship. ? ?Past Medical History: ?Patient Active Problem List  ? Diagnosis Date Noted  ? Anxiety with depression 09/15/2021  ? ADHD (attention deficit hyperactivity disorder) 01/14/2016  ? ?Past Surgical History:  ?Procedure Laterality Date  ? WISDOM TOOTH EXTRACTION  04/2021  ? ?Family History  ?Problem Relation Age of Onset  ? Hypertension Mother   ? Diabetes Paternal Grandmother   ? Heart disease Paternal Grandfather   ? ?Outpatient Medications Prior to Visit  ?Medication Sig Dispense Refill  ? lisdexamfetamine (VYVANSE) 70 MG capsule Take 1 capsule (70 mg total) by mouth daily with breakfast. 30 capsule 0  ? ?No facility-administered medications prior to visit.  ? ?Allergies  ?Allergen Reactions  ? Amoxicillin Er Hives  ? ?   ?Objective:  ? ?Today's Vitals  ? 12/15/21 1532  ?BP: 122/80  ?Pulse: (!) 120  ?Temp: 98.5 ?F (36.9 ?C)  ?TempSrc: Temporal  ?SpO2: 95%  ?Weight: 210 lb (95.3 kg)  ?Height: 5\' 9"  (1.753 m)   ? ?Body mass index is 31.01 kg/m?.  ? ?General: Well developed, well nourished. No acute distress. ?Psych: Alert and oriented. Normal mood and affect. ? ?Health Maintenance Due  ?Topic Date Due  ? HPV VACCINES (1 - Male 2-dose series) Never done  ?   ?Depression screen [facility] 2/9 09/15/2021 09/15/2021  ?Decreased Interest 1 1  ?Down, Depressed, Hopeless 1 1  ?PHQ - 2 Score 2 2  ?Altered sleeping 1 -  ?Tired, decreased energy 2 -  ?Change in appetite 0 -  ?Feeling bad or failure about yourself  2 -  ?Trouble concentrating 1 -  ?Moving slowly or fidgety/restless 0 -  ?Suicidal thoughts 1 -  ?PHQ-9 Score 9 -  ?Difficult doing work/chores Somewhat difficult -  ? ? ?Assessment & Plan:  ? ?1. Attention deficit hyperactivity disorder (ADHD), combined type ?Stable on Vyvanse. ? ?- lisdexamfetamine (VYVANSE) 70 MG capsule; Take 1 capsule (70 mg total) by mouth daily with breakfast.  Dispense: 30 capsule; Refill: 0 ? ?2. Anxiety with depression ?Continues in counseling, but has seen overall improvements in his mood and the impacts on his personal life. ? ?Return in about 6 months (around 06/17/2022).  ? ?06/19/2022, MD ?

## 2021-12-26 ENCOUNTER — Other Ambulatory Visit: Payer: Self-pay | Admitting: Family Medicine

## 2021-12-26 DIAGNOSIS — F902 Attention-deficit hyperactivity disorder, combined type: Secondary | ICD-10-CM

## 2021-12-26 NOTE — Telephone Encounter (Signed)
Duplicate Refill request  ? ?Vyvance 70 mg ?LR 12/15/21, #30, 0 rf ?LOV 12/15/21 ?FOV 06/17/22 ?

## 2021-12-26 NOTE — Telephone Encounter (Signed)
I am unable to deny med refill due to it is controlled.  ?

## 2022-01-26 ENCOUNTER — Other Ambulatory Visit: Payer: Self-pay | Admitting: Family Medicine

## 2022-01-26 DIAGNOSIS — F902 Attention-deficit hyperactivity disorder, combined type: Secondary | ICD-10-CM

## 2022-01-26 MED ORDER — LISDEXAMFETAMINE DIMESYLATE 70 MG PO CAPS: 70.0000 mg | ORAL_CAPSULE | Freq: Every day | ORAL | 0 refills | Status: DC

## 2022-03-03 ENCOUNTER — Other Ambulatory Visit: Payer: Self-pay | Admitting: Family Medicine

## 2022-03-03 DIAGNOSIS — F902 Attention-deficit hyperactivity disorder, combined type: Secondary | ICD-10-CM

## 2022-03-03 MED ORDER — LISDEXAMFETAMINE DIMESYLATE 70 MG PO CAPS: 70.0000 mg | ORAL_CAPSULE | Freq: Every day | ORAL | 0 refills | Status: DC

## 2022-03-03 NOTE — Telephone Encounter (Signed)
Refill request for  Vyvance 70 mg LR 01/26/22, # 30, 0 rf LOV 12/15/21 FOV 06/17/22  Please review and advise.  Thanks. Dm/cma

## 2022-04-05 ENCOUNTER — Other Ambulatory Visit: Payer: Self-pay | Admitting: Family Medicine

## 2022-04-05 DIAGNOSIS — F902 Attention-deficit hyperactivity disorder, combined type: Secondary | ICD-10-CM

## 2022-04-06 MED ORDER — LISDEXAMFETAMINE DIMESYLATE 70 MG PO CAPS: 70.0000 mg | ORAL_CAPSULE | Freq: Every day | ORAL | 0 refills | Status: DC

## 2022-04-06 NOTE — Addendum Note (Signed)
Addended by: Loyola Mast on: 04/06/2022 01:43 PM   Modules accepted: Orders

## 2022-04-07 ENCOUNTER — Telehealth: Payer: Self-pay | Admitting: Family Medicine

## 2022-04-07 DIAGNOSIS — F902 Attention-deficit hyperactivity disorder, combined type: Secondary | ICD-10-CM

## 2022-04-07 NOTE — Telephone Encounter (Signed)
Pt is needing his lisdexamfetamine (VYVANSE) 70 MG capsule [[id number]]  [id number] [place] - [address] [place], [place] [address]  Phone:  [phone]  Fax:  [phone]  DEA #:  [id number].  It looks like the transmission failed E-Prescribing Status: Transmission to pharmacy failed (04/06/2022  3:43 PM EDT)

## 2022-04-07 NOTE — Telephone Encounter (Signed)
RX didn't go through to the pharmacy yesterday.  Can you please and thank you re-send it.  Dm/cma

## 2022-04-08 ENCOUNTER — Telehealth: Payer: Self-pay | Admitting: Family Medicine

## 2022-04-08 MED ORDER — LISDEXAMFETAMINE DIMESYLATE 70 MG PO CAPS: 70.0000 mg | ORAL_CAPSULE | Freq: Every day | ORAL | 0 refills | Status: DC

## 2022-04-08 NOTE — Telephone Encounter (Signed)
RX printed and given to mom. Dm/cma

## 2022-04-08 NOTE — Telephone Encounter (Signed)
  Encourage patient to contact the pharmacy for refills or they can request refills through [facility]  LAST APPOINTMENT DATE:  Please schedule appointment if longer than 1 year  NEXT APPOINTMENT DATE:  MEDICATION:lisdexamfetamine (VYVANSE) 70 MG capsule  Is the patient out of medication?   [id number] [place] [place] - [phone] Phone:  [phone]      Let patient know to contact pharmacy at the end of the day to make sure medication is ready.  Please notify patient to allow 48-72 hours to process  CLINICAL FILLS OUT ALL BELOW:   LAST REFILL:  QTY:  REFILL DATE:    OTHER COMMENTS: pt mom stated that the pharmacy don't have the order and she will be here today because she would like a paper prescription so she can take to the pharmacy herself because ept need the medication to function at work   Okay for refill?  Please advise

## 2022-04-08 NOTE — Telephone Encounter (Signed)
Dr Veto Kemps printed out RX on RX paper and given to patient's mother.  .dmc

## 2022-04-08 NOTE — Telephone Encounter (Signed)
Spoke to Pharmacy they didn't receive it and  had problems with their system.  Can you please and thank you re-send it to them? Thanks. Dm/cma

## 2022-05-03 ENCOUNTER — Encounter: Payer: Self-pay | Admitting: Family Medicine

## 2022-05-05 NOTE — Telephone Encounter (Signed)
Called patient Via phone.  He will check to see if [facility] or other pharmacy might have the Vyvance in stock and find out when they should be getting them in. Dm/cma

## 2022-05-06 ENCOUNTER — Telehealth: Payer: Self-pay | Admitting: Family Medicine

## 2022-05-06 ENCOUNTER — Other Ambulatory Visit: Payer: Self-pay | Admitting: Family Medicine

## 2022-05-06 DIAGNOSIS — F902 Attention-deficit hyperactivity disorder, combined type: Secondary | ICD-10-CM

## 2022-05-06 MED ORDER — LISDEXAMFETAMINE DIMESYLATE 50 MG PO CAPS: 50.0000 mg | ORAL_CAPSULE | Freq: Every day | ORAL | 0 refills | Status: DC

## 2022-05-06 NOTE — Telephone Encounter (Signed)
Patient's mother, French Ana notified VIA phone. Dm/cma

## 2022-05-06 NOTE — Telephone Encounter (Signed)
Caller Name: Jeral Zick  Call back phone #: [phone]  Reason for Call: Vyvance 70mg  is on back order for all [facility] in the area. [facility] [address], [place], [place] [facility] 50mg  in stock. Pt would like to know if they could get a temporary prescription sent there, also stated that a PA would be needed with insurance.

## 2022-05-06 NOTE — Telephone Encounter (Signed)
See other message (phone).  Dm/cma

## 2022-06-02 ENCOUNTER — Encounter: Payer: Self-pay | Admitting: Family Medicine

## 2022-06-02 ENCOUNTER — Ambulatory Visit: Payer: BC Managed Care – PPO | Admitting: Family Medicine

## 2022-06-02 VITALS — BP 118/74 | HR 77 | Temp 97.7°F | Wt 203.0 lb

## 2022-06-02 DIAGNOSIS — S60552D Superficial foreign body of left hand, subsequent encounter: Secondary | ICD-10-CM | POA: Diagnosis not present

## 2022-06-02 DIAGNOSIS — J019 Acute sinusitis, unspecified: Secondary | ICD-10-CM | POA: Insufficient documentation

## 2022-06-02 DIAGNOSIS — S60552A Superficial foreign body of left hand, initial encounter: Secondary | ICD-10-CM | POA: Insufficient documentation

## 2022-06-02 DIAGNOSIS — J029 Acute pharyngitis, unspecified: Secondary | ICD-10-CM | POA: Diagnosis not present

## 2022-06-02 LAB — POCT RAPID STREP A (OFFICE): Rapid Strep A Screen: NEGATIVE

## 2022-06-02 MED ORDER — DOXYCYCLINE HYCLATE 100 MG PO TABS: 100.0000 mg | ORAL_TABLET | Freq: Two times a day (BID) | ORAL | 0 refills | Status: AC

## 2022-06-02 MED ORDER — NYSTATIN 100000 UNIT/ML MT SUSP: 5.0000 mL | Freq: Three times a day (TID) | OROMUCOSAL | 0 refills | Status: DC | PRN

## 2022-06-02 NOTE — Assessment & Plan Note (Addendum)
Associated with sore throat and postnasal drip Etiology viral versus bacterial Starting doxycycline for possible hand infection, although may cover some bacterial causes of sinusitis Continue supportive care with OTC Flonase and ibuprofen Treat sore throat with Magic mouthwash Return precaution discussed

## 2022-06-02 NOTE — Assessment & Plan Note (Signed)
That punctured with piece of wood Ongoing for 3 weeks, but improved, no purulent drainage or fevers Mildly tender Outside work-up showed negative x-ray Outside clinic wound exploration, documented no purulent drainage, but no I&D was performed Status post doxycycline 100 mg twice daily for 1 week Concern for possible underlying abscess but unsure, given location I do not feel qualified to perform I&D Counseled patient about concern for underlying infection, discussed limitations of treatment at this location Did discuss treatment options including antibiotics, ED, referral to hand surgery Urgent referral to hand surgery placed We will also prescribed doxycycline 100 mg twice daily x7 Return precautions discussed

## 2022-06-02 NOTE — Progress Notes (Signed)
Assessment/Plan:   Problem List Items Addressed This Visit       Respiratory   Acute non-recurrent sinusitis    Associated with sore throat and postnasal drip Etiology viral versus bacterial Starting doxycycline for possible hand infection, although may cover some bacterial causes of sinusitis Continue supportive care with OTC Flonase and ibuprofen Treat sore throat with Magic mouthwash Return precaution discussed      Relevant Medications   doxycycline (VIBRA-TABS) 100 MG tablet   magic mouthwash (nystatin, diphenhydrAMINE, alum & mag hydroxide) suspension mixture     Other   Superficial foreign body of left hand    That punctured with piece of wood Ongoing for 3 weeks, but improved, no purulent drainage or fevers Mildly tender Outside work-up showed negative x-ray Outside clinic wound exploration, documented no purulent drainage, but no I&D was performed Status post doxycycline 100 mg twice daily for 1 week Concern for possible underlying abscess but unsure, given location I do not feel qualified to perform I&D Counseled patient about concern for underlying infection, discussed limitations of treatment at this location Did discuss treatment options including antibiotics, ED, referral to hand surgery Urgent referral to hand surgery placed We will also prescribed doxycycline 100 mg twice daily x7 Return precautions discussed        Relevant Medications   doxycycline (VIBRA-TABS) 100 MG tablet   Other Relevant Orders   Ambulatory referral to Hand Surgery   Other Visit Diagnoses     Sore throat    -  Primary   Relevant Medications   doxycycline (VIBRA-TABS) 100 MG tablet   magic mouthwash (nystatin, diphenhydrAMINE, alum & mag hydroxide) suspension mixture   Other Relevant Orders   POCT rapid strep A (Completed)          Subjective:  HPI:  Bruce Barton is a 21 y.o. male who has ADHD (attention deficit hyperactivity disorder); Anxiety with depression;  Acute non-recurrent sinusitis; and Superficial foreign body of left hand on their problem list..   He  has a past medical history of ADHD (attention deficit hyperactivity disorder), combined type (01/14/2016), Dysgraphia (01/14/2016), and Dysgraphia (01/14/2016).Marland Kitchen   He presents with chief complaint of Sinus Problem (Sore throat, sinus drainage, difficulty swallowing x 2 days. Patient states when he sneezed into a napkin this morning and noticed green mucus. Patient also had a splinter in his left palm and removed it. Was seen at urgent care 2 weeks ago for treatment. Patient states that it feels like a cyst is now present.) .   Sore Throat: Patient complains of sore throat. Associated symptoms include nasal blockage, post nasal drip, sinus and nasal congestion, and sore throat.Onset of symptoms was 2 days ago, unchanged since that time. He is drinking plenty of fluids. He has not had recent close exposure to someone with proven streptococcal pharyngitis.  Patient reports negative COVID test x3.  Patient reports ongoing right hand pain.  Patient states that he had a splinter that went into palmar surface of hand between thumb and second digit.  This occurred 3 weeks ago.  Patient was seen in urgent care approximately 2 weeks ago.  Hand x-ray showed no foreign body.  Patient did have wound explored with scalpel.  Per notes, no foreign body was found.  There was no purulent drainage.  The note says that no I&D was completed.  Patient was prescribed doxycycline.  Does report that the wound did get better after this.  He is not taking anything for the pain.  He denies any purulent drainage today.  Denies any fevers. Past Surgical History:  Procedure Laterality Date   WISDOM TOOTH EXTRACTION  04/2021    Outpatient Medications Prior to Visit  Medication Sig Dispense Refill   lisdexamfetamine (VYVANSE) 50 MG capsule Take 1 capsule (50 mg total) by mouth daily. 30 capsule 0   No facility-administered  medications prior to visit.    Family History  Problem Relation Age of Onset   Hypertension Mother    Diabetes Paternal Grandmother    Heart disease Paternal Grandfather     Social History   Socioeconomic History   Marital status: Single    Spouse name: Not on file   Number of children: 0   Years of education: Not on file   Highest education level: 12th grade  Occupational History   Occupation: Landscaping/Maintenance    Employer: [employer]  Tobacco Use   Smoking status: Every Day    Packs/day: 0.00    Years: 5.00    Total pack years: 0.00    Types: E-cigarettes, Cigarettes    Start date: 05/29/2016    Last attempt to quit: 01/2021    Years since quitting: 1.3   Smokeless tobacco: Former    Types: Chew    Quit date: 09/18/2016   Tobacco comments:    disposable - one per two week  Vaping Use   Vaping Use: Every day   Substances: Nicotine   Devices: [place]  Substance and Sexual Activity   Alcohol use: Yes    Alcohol/week: 1.0 standard drink of alcohol    Types: 1 Cans of beer per week    Comment: socially   Drug use: Not Currently    Types: Marijuana   Sexual activity: Yes    Partners: Female    Birth control/protection: Condom  Other Topics Concern   Not on file  Social History Narrative   Not on file   Social Determinants of Health   Financial Resource Strain: Not on file  Food Insecurity: Not on file  Transportation Needs: Not on file  Physical Activity: Not on file  Stress: Not on file  Social Connections: Not on file  Intimate Partner Violence: Not on file                                                                                                 Objective:  Physical Exam: BP 118/74 (BP Location: Left Arm, Patient Position: Sitting, Cuff Size: Large)   Pulse 77   Temp 97.7 F (36.5 C) (Temporal)   Wt 203 lb (92.1 kg)   SpO2 98%   BMI 29.98 kg/m    General: No acute distress. Awake and conversant.  Eyes: Normal  conjunctiva, anicteric. Round symmetric pupils.  ENT: Hearing grossly intact. No nasal discharge.  Right tonsil 2+, no tonsil visualized, posterior pharynx erythematous no other oropharyngeal lesions Neck: Neck is supple. No masses or thyromegaly.  Respiratory: Respirations are non-labored. No auditory wheezing.  CTA B Skin: Warm. No rashes or ulcers.  Psych: Alert and oriented. Cooperative, Appropriate mood and affect, Normal judgment.  CV:  No cyanosis or JVD, normal S1-S2, no MRG, RRR MSK: Normal ambulation. No clubbing, right hand located between thumb and second digit on the palmar aspect there is a elevated callus approximately 1 cm diameter, mildly tender to palpation, no drainage observed, no overlying erythema or contusion Neuro: Sensation and CN II-XII grossly normal.        Garner Nash, MD, MS

## 2022-06-02 NOTE — Patient Instructions (Addendum)
For Hand,  take doxycycline, We are urgently referring to Hand Surgery, Go to ED if pain gets worse  For sore throat and sinus infection, take doxycycline, continue flonase, use mouth wash for pain

## 2022-06-03 ENCOUNTER — Other Ambulatory Visit: Payer: Self-pay | Admitting: Family Medicine

## 2022-06-03 DIAGNOSIS — F902 Attention-deficit hyperactivity disorder, combined type: Secondary | ICD-10-CM

## 2022-06-03 MED ORDER — LISDEXAMFETAMINE DIMESYLATE 50 MG PO CAPS: 50.0000 mg | ORAL_CAPSULE | Freq: Every day | ORAL | 0 refills | Status: DC

## 2022-06-03 NOTE — Telephone Encounter (Signed)
Refill request for Vyvance 50 mg  LR 05/06/22, #30, 0 rf LOV  12/15/21 FOV  06/17/22  Please review and advise.  Thanks. Dm/cma

## 2022-06-03 NOTE — Telephone Encounter (Signed)
Lft VM  that RX was sent to the pharmacy. Dm/cma  

## 2022-06-03 NOTE — Telephone Encounter (Signed)
Caller Name: pt wife Call back phone #: [phone]   MEDICATION(S):  lisdexamfetamine (VYVANSE) 50 MG capsule  Days of Med Remaining:   Has the patient contacted their pharmacy (YES/NO)? y What did pharmacy advise?   Preferred Pharmacy: [id number] [place] [phone])  ~~~Please advise patient/caregiver to allow 2-3 business days to process RX refills. Pt stated that she been calling around and this is the only pharmacy that had this medication

## 2022-06-09 ENCOUNTER — Encounter: Payer: Self-pay | Admitting: Orthopaedic Surgery

## 2022-06-09 ENCOUNTER — Ambulatory Visit (INDEPENDENT_AMBULATORY_CARE_PROVIDER_SITE_OTHER): Payer: BC Managed Care – PPO | Admitting: Orthopaedic Surgery

## 2022-06-09 ENCOUNTER — Other Ambulatory Visit: Payer: Self-pay

## 2022-06-09 ENCOUNTER — Ambulatory Visit (INDEPENDENT_AMBULATORY_CARE_PROVIDER_SITE_OTHER): Payer: BC Managed Care – PPO

## 2022-06-09 ENCOUNTER — Encounter (HOSPITAL_BASED_OUTPATIENT_CLINIC_OR_DEPARTMENT_OTHER): Payer: Self-pay | Admitting: Orthopaedic Surgery

## 2022-06-09 DIAGNOSIS — L02512 Cutaneous abscess of left hand: Secondary | ICD-10-CM | POA: Diagnosis not present

## 2022-06-09 NOTE — Progress Notes (Signed)
Office Visit Note   Patient: Bruce Barton           Date of Birth: 05-11-2001           MRN: [id number] Visit Date: 06/09/2022              Requested by: Garnette Gunner, MD [address] [place],  [place] [address] PCP: Loyola Mast, MD   Assessment & Plan: Visit Diagnoses:  1. Abscess of left hand     Plan: Impression is left hand abscess.  Based on findings this will need formal incision and drainage in the operating room and intraoperative cultures.  Details of the surgery reviewed with the patient.  Risk benefits prognosis reviewed.  Anticipate time out of work 2 to 3 weeks if light duty is not available.  Follow-Up Instructions: No follow-ups on file.   Orders:  Orders Placed This Encounter  Procedures   XR Hand Complete Left   No orders of the defined types were placed in this encounter.     Procedures: No procedures performed   Clinical Data: No additional findings.   Subjective: Chief Complaint  Patient presents with   Left Hand - Pain    HPI Bruce Barton is a very pleasant 21 year old gentleman here for evaluation of left hand abscess.  He had his hand on a wooden rail a few weeks ago and suffered a splinter.  He removed it and drained himself but then it got infected.  He has been on 2 rounds of doxycycline.  Played golf on Sunday and got worse with purulent drainage.  Denies any constitutional symptoms. Review of Systems  Constitutional: Negative.   All other systems reviewed and are negative.    Objective: Vital Signs: There were no vitals taken for this visit.  Physical Exam Vitals and nursing note reviewed.  Constitutional:      Appearance: He is well-developed.  HENT:     Head: Normocephalic and atraumatic.  Eyes:     Pupils: Pupils are equal, round, and reactive to light.  Pulmonary:     Effort: Pulmonary effort is normal.  Abdominal:     Palpations: Abdomen is soft.  Musculoskeletal:        General: Normal range of motion.      Cervical back: Neck supple.  Skin:    General: Skin is warm.  Neurological:     Mental Status: He is alert and oriented to person, place, and time.  Psychiatric:        Behavior: Behavior normal.        Thought Content: Thought content normal.        Judgment: Judgment normal.     Ortho Exam Examination of left hand shows a small abscess in the volar first webspace with purulence.  There is no cellulitis.  Tenderness to palpation.  Motor or sensory function intact.  Compartments are soft. Specialty Comments:  No specialty comments available.  Imaging: No results found.   PMFS History: Patient Active Problem List   Diagnosis Date Noted   Acute non-recurrent sinusitis 06/02/2022   Superficial foreign body of left hand 06/02/2022   Anxiety with depression 09/15/2021   ADHD (attention deficit hyperactivity disorder) 01/14/2016   Past Medical History:  Diagnosis Date   ADHD (attention deficit hyperactivity disorder), combined type 01/14/2016   Dysgraphia 01/14/2016   Dysgraphia 01/14/2016    Family History  Problem Relation Age of Onset   Hypertension Mother    Diabetes Paternal Grandmother  Heart disease Paternal Grandfather     Past Surgical History:  Procedure Laterality Date   WISDOM TOOTH EXTRACTION  04/2021   Social History   Occupational History   Occupation: Artist: [employer]  Tobacco Use   Smoking status: Every Day    Packs/day: 0.00    Years: 5.00    Total pack years: 0.00    Types: E-cigarettes, Cigarettes    Start date: 05/29/2016    Last attempt to quit: 01/2021    Years since quitting: 1.3   Smokeless tobacco: Former    Types: Chew    Quit date: 09/18/2016   Tobacco comments:    disposable - one per two week  Vaping Use   Vaping Use: Every day   Substances: Nicotine   Devices: Elf Bar  Substance and Sexual Activity   Alcohol use: Yes    Alcohol/week: 1.0 standard drink of alcohol    Types: 1  Cans of beer per week    Comment: socially   Drug use: Not Currently    Types: Marijuana   Sexual activity: Yes    Partners: Female    Birth control/protection: Condom

## 2022-06-10 ENCOUNTER — Ambulatory Visit (HOSPITAL_BASED_OUTPATIENT_CLINIC_OR_DEPARTMENT_OTHER): Payer: BC Managed Care – PPO | Admitting: Anesthesiology

## 2022-06-10 ENCOUNTER — Encounter (HOSPITAL_BASED_OUTPATIENT_CLINIC_OR_DEPARTMENT_OTHER): Payer: Self-pay | Admitting: Orthopaedic Surgery

## 2022-06-10 ENCOUNTER — Encounter (HOSPITAL_BASED_OUTPATIENT_CLINIC_OR_DEPARTMENT_OTHER)
Admission: RE | Disposition: A | Discharge status: Home / Self Care | Payer: Self-pay | Source: Home / Self Care | Attending: Orthopaedic Surgery

## 2022-06-10 ENCOUNTER — Ambulatory Visit (HOSPITAL_BASED_OUTPATIENT_CLINIC_OR_DEPARTMENT_OTHER)
Admission: RE | Admit: 2022-06-10 | Discharge: 2022-06-10 | Disposition: A | Discharge status: Home / Self Care | Payer: BC Managed Care – PPO | Attending: Orthopaedic Surgery | Admitting: Orthopaedic Surgery

## 2022-06-10 ENCOUNTER — Other Ambulatory Visit: Payer: Self-pay

## 2022-06-10 DIAGNOSIS — F17210 Nicotine dependence, cigarettes, uncomplicated: Secondary | ICD-10-CM | POA: Diagnosis not present

## 2022-06-10 DIAGNOSIS — M795 Residual foreign body in soft tissue: Secondary | ICD-10-CM | POA: Insufficient documentation

## 2022-06-10 DIAGNOSIS — L02512 Cutaneous abscess of left hand: Secondary | ICD-10-CM | POA: Diagnosis present

## 2022-06-10 DIAGNOSIS — F17290 Nicotine dependence, other tobacco product, uncomplicated: Secondary | ICD-10-CM | POA: Insufficient documentation

## 2022-06-10 HISTORY — PX: I & D EXTREMITY: SHX5045

## 2022-06-10 SURGERY — IRRIGATION AND DEBRIDEMENT EXTREMITY
Anesthesia: General | Site: Hand | Laterality: Left

## 2022-06-10 MED ORDER — KETOROLAC TROMETHAMINE 30 MG/ML IJ SOLN
30.0000 mg | Freq: Once | INTRAMUSCULAR | Status: AC
  Administered 2022-06-10: 30 mg via INTRAVENOUS

## 2022-06-10 MED ORDER — LACTATED RINGERS IV SOLN: INTRAVENOUS | Status: DC

## 2022-06-10 MED ORDER — ACETAMINOPHEN 10 MG/ML IV SOLN: 1000.0000 mg | Freq: Once | INTRAVENOUS | Status: DC | PRN

## 2022-06-10 MED ORDER — BUPIVACAINE HCL 0.25 % IJ SOLN
INTRAMUSCULAR | Status: DC | PRN
  Administered 2022-06-10: 5 mL

## 2022-06-10 MED ORDER — PROMETHAZINE HCL 25 MG/ML IJ SOLN: 6.2500 mg | INTRAMUSCULAR | Status: DC | PRN

## 2022-06-10 MED ORDER — FENTANYL CITRATE (PF) 100 MCG/2ML IJ SOLN
INTRAMUSCULAR | Status: DC | PRN
  Administered 2022-06-10: 25 ug via INTRAVENOUS
  Administered 2022-06-10: 50 ug via INTRAVENOUS
  Administered 2022-06-10: 25 ug via INTRAVENOUS

## 2022-06-10 MED ORDER — ONDANSETRON HCL 4 MG PO TABS: 4.0000 mg | ORAL_TABLET | Freq: Three times a day (TID) | ORAL | 0 refills | Status: DC | PRN

## 2022-06-10 MED ORDER — OXYCODONE HCL 5 MG/5ML PO SOLN: 5.0000 mg | Freq: Once | ORAL | Status: AC | PRN

## 2022-06-10 MED ORDER — DEXAMETHASONE SODIUM PHOSPHATE 10 MG/ML IJ SOLN
INTRAMUSCULAR | Status: AC
  Filled 2022-06-10: qty 1

## 2022-06-10 MED ORDER — FENTANYL CITRATE (PF) 100 MCG/2ML IJ SOLN
INTRAMUSCULAR | Status: AC
  Filled 2022-06-10: qty 2

## 2022-06-10 MED ORDER — MIDAZOLAM HCL 5 MG/5ML IJ SOLN
INTRAMUSCULAR | Status: DC | PRN
  Administered 2022-06-10: 2 mg via INTRAVENOUS

## 2022-06-10 MED ORDER — OXYCODONE HCL 5 MG PO TABS
ORAL_TABLET | ORAL | Status: AC
  Filled 2022-06-10: qty 1

## 2022-06-10 MED ORDER — ONDANSETRON HCL 4 MG/2ML IJ SOLN
INTRAMUSCULAR | Status: DC | PRN
  Administered 2022-06-10: 4 mg via INTRAVENOUS

## 2022-06-10 MED ORDER — SULFAMETHOXAZOLE-TRIMETHOPRIM 800-160 MG PO TABS: 1.0000 | ORAL_TABLET | Freq: Two times a day (BID) | ORAL | 0 refills | Status: DC

## 2022-06-10 MED ORDER — LIDOCAINE 2% (20 MG/ML) 5 ML SYRINGE
INTRAMUSCULAR | Status: AC
  Filled 2022-06-10: qty 5

## 2022-06-10 MED ORDER — FENTANYL CITRATE (PF) 100 MCG/2ML IJ SOLN: 25.0000 ug | INTRAMUSCULAR | Status: DC | PRN

## 2022-06-10 MED ORDER — ONDANSETRON HCL 4 MG/2ML IJ SOLN
INTRAMUSCULAR | Status: AC
  Filled 2022-06-10: qty 2

## 2022-06-10 MED ORDER — OXYCODONE HCL 5 MG PO TABS
5.0000 mg | ORAL_TABLET | Freq: Once | ORAL | Status: AC | PRN
  Administered 2022-06-10: 5 mg via ORAL

## 2022-06-10 MED ORDER — CEFAZOLIN SODIUM-DEXTROSE 2-4 GM/100ML-% IV SOLN
INTRAVENOUS | Status: AC
  Filled 2022-06-10: qty 100

## 2022-06-10 MED ORDER — LIDOCAINE HCL (CARDIAC) PF 100 MG/5ML IV SOSY
PREFILLED_SYRINGE | INTRAVENOUS | Status: DC | PRN
  Administered 2022-06-10: 60 mg via INTRATRACHEAL

## 2022-06-10 MED ORDER — KETOROLAC TROMETHAMINE 30 MG/ML IJ SOLN
INTRAMUSCULAR | Status: AC
  Filled 2022-06-10: qty 1

## 2022-06-10 MED ORDER — AMISULPRIDE (ANTIEMETIC) 5 MG/2ML IV SOLN: 10.0000 mg | Freq: Once | INTRAVENOUS | Status: DC | PRN

## 2022-06-10 MED ORDER — CEFAZOLIN SODIUM-DEXTROSE 2-4 GM/100ML-% IV SOLN
2.0000 g | Freq: Once | INTRAVENOUS | Status: AC
  Administered 2022-06-10: 2 g via INTRAVENOUS

## 2022-06-10 MED ORDER — DEXAMETHASONE SODIUM PHOSPHATE 10 MG/ML IJ SOLN
INTRAMUSCULAR | Status: DC | PRN
  Administered 2022-06-10: 10 mg via INTRAVENOUS

## 2022-06-10 MED ORDER — PROPOFOL 10 MG/ML IV BOLUS
INTRAVENOUS | Status: DC | PRN
  Administered 2022-06-10: 50 mg via INTRAVENOUS
  Administered 2022-06-10: 200 mg via INTRAVENOUS

## 2022-06-10 MED ORDER — PROPOFOL 10 MG/ML IV BOLUS
INTRAVENOUS | Status: AC
  Filled 2022-06-10: qty 20

## 2022-06-10 MED ORDER — HYDROCODONE-ACETAMINOPHEN 5-325 MG PO TABS: 1.0000 | ORAL_TABLET | Freq: Four times a day (QID) | ORAL | 0 refills | Status: DC | PRN

## 2022-06-10 MED ORDER — MIDAZOLAM HCL 2 MG/2ML IJ SOLN
INTRAMUSCULAR | Status: AC
  Filled 2022-06-10: qty 2

## 2022-06-10 SURGICAL SUPPLY — 54 items
ADH SKN CLS APL DERMABOND .7 (GAUZE/BANDAGES/DRESSINGS)
BAND INSRT 18 STRL LF DISP RB (MISCELLANEOUS) ×1
BAND RUBBER #18 3X1/16 STRL (MISCELLANEOUS) ×1 IMPLANT
BLADE SURG 15 STRL LF DISP TIS (BLADE) ×2 IMPLANT
BLADE SURG 15 STRL SS (BLADE) ×2
BNDG CMPR 75X21 PLY HI ABS (MISCELLANEOUS) ×1
BNDG CMPR 9X4 STRL LF SNTH (GAUZE/BANDAGES/DRESSINGS) ×1
BNDG COHESIVE 1X5 TAN STRL LF (GAUZE/BANDAGES/DRESSINGS) IMPLANT
BNDG ELASTIC 3X5.8 VLCR STR LF (GAUZE/BANDAGES/DRESSINGS) IMPLANT
BNDG ESMARK 4X9 LF (GAUZE/BANDAGES/DRESSINGS) ×1 IMPLANT
BRUSH SCRUB EZ PLAIN DRY (MISCELLANEOUS) ×1 IMPLANT
CORD BIPOLAR FORCEPS 12FT (ELECTRODE) ×1 IMPLANT
COVER BACK TABLE 60X90IN (DRAPES) ×1 IMPLANT
COVER MAYO STAND STRL (DRAPES) ×1 IMPLANT
CUFF TOURN SGL QUICK 18X4 (TOURNIQUET CUFF) IMPLANT
DERMABOND ADVANCED .7 DNX12 (GAUZE/BANDAGES/DRESSINGS) IMPLANT
DRAPE EXTREMITY T 121X128X90 (DISPOSABLE) ×1 IMPLANT
DRAPE IMP U-DRAPE 54X76 (DRAPES) ×1 IMPLANT
DRAPE SURG 17X23 STRL (DRAPES) ×1 IMPLANT
GAUZE 4X4 16PLY ~~LOC~~+RFID DBL (SPONGE) IMPLANT
GAUZE PACKING IODOFORM 1/4X15 (PACKING) IMPLANT
GAUZE SPONGE 4X4 12PLY STRL (GAUZE/BANDAGES/DRESSINGS) ×1 IMPLANT
GAUZE STRETCH 2X75IN STRL (MISCELLANEOUS) ×1 IMPLANT
GAUZE XEROFORM 1X8 LF (GAUZE/BANDAGES/DRESSINGS) ×1 IMPLANT
GLOVE ECLIPSE 7.0 STRL STRAW (GLOVE) ×1 IMPLANT
GLOVE INDICATOR 7.0 STRL GRN (GLOVE) ×1 IMPLANT
GLOVE INDICATOR 7.5 STRL GRN (GLOVE) ×1 IMPLANT
GLOVE SURG SYN 7.5  E (GLOVE) ×2
GLOVE SURG SYN 7.5 E (GLOVE) ×2 IMPLANT
GLOVE SURG SYN 7.5 PF PI (GLOVE) ×2 IMPLANT
GOWN STRL REIN XL XLG (GOWN DISPOSABLE) ×1 IMPLANT
GOWN STRL REUS W/ TWL LRG LVL3 (GOWN DISPOSABLE) ×1 IMPLANT
GOWN STRL REUS W/ TWL XL LVL3 (GOWN DISPOSABLE) ×2 IMPLANT
GOWN STRL REUS W/TWL LRG LVL3 (GOWN DISPOSABLE) ×1
GOWN STRL REUS W/TWL XL LVL3 (GOWN DISPOSABLE) ×2
NDL HYPO 25X1 1.5 SAFETY (NEEDLE) IMPLANT
NEEDLE HYPO 25X1 1.5 SAFETY (NEEDLE) IMPLANT
NS IRRIG 1000ML POUR BTL (IV SOLUTION) IMPLANT
PACK BASIN DAY SURGERY FS (CUSTOM PROCEDURE TRAY) ×1 IMPLANT
SHEET MEDIUM DRAPE 40X70 STRL (DRAPES) ×1 IMPLANT
SPIKE FLUID TRANSFER (MISCELLANEOUS) IMPLANT
STOCKINETTE 4X48 STRL (DRAPES) IMPLANT
SUT ETHILON 2 0 FS 18 (SUTURE) IMPLANT
SUT ETHILON 4 0 PS 2 18 (SUTURE) ×1 IMPLANT
SUT VIC AB 0 CT1 27 (SUTURE)
SUT VIC AB 0 CT1 27XBRD ANBCTR (SUTURE) IMPLANT
SUT VIC AB 2-0 CT1 27 (SUTURE)
SUT VIC AB 2-0 CT1 TAPERPNT 27 (SUTURE) IMPLANT
SWAB COLLECTION DEVICE MRSA (MISCELLANEOUS) IMPLANT
SWAB CULTURE ESWAB REG 1ML (MISCELLANEOUS) IMPLANT
SYR BULB EAR ULCER 3OZ GRN STR (SYRINGE) IMPLANT
SYR CONTROL 10ML LL (SYRINGE) IMPLANT
TOWEL GREEN STERILE FF (TOWEL DISPOSABLE) ×1 IMPLANT
TRAY DSU PREP LF (CUSTOM PROCEDURE TRAY) ×1 IMPLANT

## 2022-06-10 NOTE — Anesthesia Procedure Notes (Signed)
Procedure Name: LMA Insertion Date/Time: 06/10/2022 1:32 PM  Performed by: Thornell Mule, CRNAPre-anesthesia Checklist: Patient identified, Emergency Drugs available, Suction available and Patient being monitored Patient Re-evaluated:Patient Re-evaluated prior to induction Oxygen Delivery Method: Circle system utilized Preoxygenation: Pre-oxygenation with 100% oxygen Induction Type: IV induction LMA: LMA inserted LMA Size: 4.0 Number of attempts: 1 Placement Confirmation: positive ETCO2 Tube secured with: Tape Dental Injury: Teeth and Oropharynx as per pre-operative assessment

## 2022-06-10 NOTE — Discharge Instructions (Addendum)
Postoperative instructions:  Weightbearing instructions: as tolerated  Dressing instructions: Keep your dressing and/or splint clean and dry at all times.  It will be removed at your first post-operative appointment.  Your stitches and/or staples will be removed at this visit.  Incision instructions:  Do not soak your incision for 3 weeks after surgery.  If the incision gets wet, pat dry and do not scrub the incision.  Pain control:  You have been given a prescription to be taken as directed for post-operative pain control.  In addition, elevate the operative extremity above the heart at all times to prevent swelling and throbbing pain.  Take over-the-counter Colace, 100mg  by mouth twice a day while taking narcotic pain medications to help prevent constipation.  Follow up appointments: 1) 2 days for wound check. 2) Dr. as scheduled.   -------------------------------------------------------------------------------------------------------------  After Surgery Pain Control:  After your surgery, post-surgical discomfort or pain is likely. This discomfort can last several days to a few weeks. At certain times of the day your discomfort may be more intense.  Did you receive a nerve block?  A nerve block can provide pain relief for one hour to two days after your surgery. As long as the nerve block is working, you will experience little or no sensation in the area the surgeon operated on.  As the nerve block wears off, you will begin to experience pain or discomfort. It is very important that you begin taking your prescribed pain medication before the nerve block fully wears off. Treating your pain at the first sign of the block wearing off will ensure your pain is better controlled and more tolerable when full-sensation returns. Do not wait until the pain is intolerable, as the medicine will be less effective. It is better to treat pain in advance than to try and catch up.  General  Anesthesia:  If you did not receive a nerve block during your surgery, you will need to start taking your pain medication shortly after your surgery and should continue to do so as prescribed by your surgeon.  Pain Medication:  Most commonly we prescribe Vicodin and Percocet for post-operative pain. Both of these medications contain a combination of acetaminophen (Tylenol) and a narcotic to help control pain.   It takes between 30 and 45 minutes before pain medication starts to work. It is important to take your medication before your pain level gets too intense.   Nausea is a common side effect of many pain medications. You will want to eat something before taking your pain medicine to help prevent nausea.   If you are taking a prescription pain medication that contains acetaminophen, we recommend that you do not take additional over the counter acetaminophen (Tylenol).  Other pain relieving options:   Using a cold pack to ice the affected area a few times a day (15 to 20 minutes at a time) can help to relieve pain, reduce swelling and bruising.   Elevation of the affected area can also help to reduce pain and swelling.    Post Anesthesia Home Care Instructions  Activity: Get plenty of rest for the remainder of the day. A responsible individual must stay with you for 24 hours following the procedure.  For the next 24 hours, DO NOT: -Drive a car -Roda Shutters -Drink alcoholic beverages -Take any medication unless instructed by your physician -Make any legal decisions or sign important papers.  Meals: Start with liquid foods such as gelatin or soup. Progress to  regular foods as tolerated. Avoid greasy, spicy, heavy foods. If nausea and/or vomiting occur, drink only clear liquids until the nausea and/or vomiting subsides. Call your physician if vomiting continues.  Special Instructions/Symptoms: Your throat may feel dry or sore from the anesthesia or the breathing tube placed in  your throat during surgery. If this causes discomfort, gargle with warm salt water. The discomfort should disappear within 24 hours.

## 2022-06-10 NOTE — Anesthesia Postprocedure Evaluation (Signed)
Anesthesia Post Note  Patient: Bruce Barton  Procedure(s) Performed: IRRIGATION AND DEBRIDEMENT EXTREMITY (Left: Hand)     Patient location during evaluation: PACU Anesthesia Type: General Level of consciousness: awake Pain management: pain level controlled Vital Signs Assessment: post-procedure vital signs reviewed and stable Respiratory status: spontaneous breathing, nonlabored ventilation, respiratory function stable and patient connected to nasal cannula oxygen Cardiovascular status: blood pressure returned to baseline and stable Postop Assessment: no apparent nausea or vomiting Anesthetic complications: no   No notable events documented.  Last Vitals:  Vitals:   06/10/22 1420 06/10/22 1434  BP:  129/72  Pulse: 93 96  Resp: 20   Temp:  37.2 C  SpO2: 97% 97%    Last Pain:  Vitals:   06/10/22 1440  TempSrc:   PainSc: 6                  Ayson Cherubini P Tish Begin

## 2022-06-10 NOTE — Transfer of Care (Signed)
Immediate Anesthesia Transfer of Care Note  Patient: Bruce Barton  Procedure(s) Performed: IRRIGATION AND DEBRIDEMENT EXTREMITY (Left: Hand)  Patient Location: PACU  Anesthesia Type:General  Level of Consciousness: drowsy, patient cooperative and responds to stimulation  Airway & Oxygen Therapy: Patient Spontanous Breathing and Patient connected to face mask oxygen  Post-op Assessment: Report given to RN and Post -op Vital signs reviewed and stable  Post vital signs: Reviewed and stable  Last Vitals:  Vitals Value Taken Time  BP 112/71 06/10/22 1409  Temp    Pulse 100 06/10/22 1410  Resp 24 06/10/22 1410  SpO2 100 % 06/10/22 1410  Vitals shown include unvalidated device data.  Last Pain:  Vitals:   06/10/22 1114  TempSrc: Oral  PainSc: 3       Patients Stated Pain Goal: 3 (06/10/22 1114)  Complications: No notable events documented.

## 2022-06-10 NOTE — Anesthesia Preprocedure Evaluation (Addendum)
Anesthesia Evaluation  Patient identified by MRN, date of birth, ID band Patient awake    Reviewed: Allergy & Precautions, NPO status , Patient's Chart, lab work & pertinent test results  Airway Mallampati: II  TM Distance: >3 FB Neck ROM: Full    Dental no notable dental hx.    Pulmonary Current Smoker and Patient abstained from smoking.,    Pulmonary exam normal        Cardiovascular negative cardio ROS Normal cardiovascular exam     Neuro/Psych PSYCHIATRIC DISORDERS Anxiety Depression negative neurological ROS     GI/Hepatic negative GI ROS, Neg liver ROS,   Endo/Other  negative endocrine ROS  Renal/GU negative Renal ROS     Musculoskeletal negative musculoskeletal ROS (+)   Abdominal   Peds  (+) ADHD Hematology negative hematology ROS (+)   Anesthesia Other Findings left hand abscess  Reproductive/Obstetrics                            Anesthesia Physical Anesthesia Plan  ASA: 2  Anesthesia Plan: General   Post-op Pain Management:    Induction: Intravenous  PONV Risk Score and Plan: 1 and Ondansetron, Dexamethasone, Midazolam and Treatment may vary due to age or medical condition  Airway Management Planned: LMA  Additional Equipment:   Intra-op Plan:   Post-operative Plan: Extubation in OR  Informed Consent: I have reviewed the patients History and Physical, chart, labs and discussed the procedure including the risks, benefits and alternatives for the proposed anesthesia with the patient or authorized representative who has indicated his/her understanding and acceptance.     Dental advisory given  Plan Discussed with: CRNA  Anesthesia Plan Comments:         Anesthesia Quick Evaluation

## 2022-06-10 NOTE — H&P (Signed)

## 2022-06-10 NOTE — Op Note (Addendum)
   Date of Surgery: 06/10/2022  INDICATIONS: Bruce Barton is a 21 y.o.-year-old male with a left hand infection.  The patient did consent to the procedure after discussion of the risks and benefits.  PREOPERATIVE DIAGNOSIS:  Left hand abscess possible retained foreign body  POSTOPERATIVE DIAGNOSIS:  Left hand abscess in the first volar webspace Retained wooden splinter  PROCEDURE:  Incision and drainage of left hand abscess with removal of retained wooden splinter  Debridement type: Excisional Debridement  Side: left  Body Location: Hand  Tools used for debridement: scalpel and scissors  SURGEON: Bruce Barton, M.D.  ASSIST: Bruce Barton, Bruce Barton; necessary for the timely completion of procedure and due to complexity of procedure.  ANESTHESIA:  general, local  IV FLUIDS AND URINE: See anesthesia.  ESTIMATED BLOOD LOSS: Minimal mL.  IMPLANTS: Iodoform packing  CULTURES: 2 intraoperative cultures  COMPLICATIONS: see description of procedure.  DESCRIPTION OF PROCEDURE: The patient was brought to the operating room.  The patient had been signed prior to the procedure and this was documented. The patient had the anesthesia placed by the anesthesiologist.  A time-out was performed to confirm that this was the correct patient, site, side and location. The patient did receive antibiotics after intraoperative cultures.  A tourniquet was placed.  The patient had the operative extremity prepped and draped in the standard surgical fashion.    The skin was incised both dorsally and volarly from the open draining wound.  The wooden splinter was found deep in the webspace and removed without difficulty.  There was a scant amount of frank pus which was cultured.  Sharp excisional debridement was performed of the skin and the subcutaneous tissue with tenotomy scissors and 15 blade.  There was no evidence of deep space infection or abscess.  After thorough debridement 3 L of normal saline  was irrigated through the area.  Bruce instruments were brought in and the wound was packed with quarter inch iodoform.  Hemostasis was obtained.  Sterile dressings were applied.  Patient tolerated the procedure well had no immediate complications.  Bruce Barton was necessary for opening, closing, retracting, limb positioning and overall facilitation and timely completion of the procedure.  POSTOPERATIVE PLAN: Patient will be discharged home and follow-up in the office in 2 days for removal of the packing.  I have placed him on empiric double strength Bactrim.  Bruce Reel, MD 1:59 PM

## 2022-06-11 ENCOUNTER — Encounter (HOSPITAL_BASED_OUTPATIENT_CLINIC_OR_DEPARTMENT_OTHER): Payer: Self-pay | Admitting: Orthopaedic Surgery

## 2022-06-12 ENCOUNTER — Other Ambulatory Visit: Payer: Self-pay | Admitting: Physician Assistant

## 2022-06-12 ENCOUNTER — Ambulatory Visit (INDEPENDENT_AMBULATORY_CARE_PROVIDER_SITE_OTHER): Payer: BC Managed Care – PPO | Admitting: Physician Assistant

## 2022-06-12 DIAGNOSIS — S60552D Superficial foreign body of left hand, subsequent encounter: Secondary | ICD-10-CM

## 2022-06-12 NOTE — Progress Notes (Signed)
   Post-Op Visit Note   Patient: Bruce Barton           Date of Birth: Apr 07, 2001           MRN: [id number] Visit Date: 06/12/2022 PCP: Loyola Mast, MD   Assessment & Plan:  Chief Complaint:  Chief Complaint  Patient presents with   Left Hand - Follow-up    Left hand I&D 06/10/2022   Visit Diagnoses:  1. Superficial foreign body of left hand, subsequent encounter     Plan: Patient is a pleasant 21 year old gentleman who comes in today 2 days status post left hand I&D and removal of a wooden foreign body, date of surgery 06/10/2022.  Cultures have been negative but have been resent for reintubation.  He is on Bactrim DS.  He denies any fevers or chills.  Examination of his left hand reveals a deep wound with minimal erythema.  No drainage.  Minimal tenderness.  Today, the packing was removed.  Moist dry dressing was performed.  We will continue this twice daily.  He will continue this at home.  He will follow-up as in 1 week for recheck.  He will continue with his Bactrim.  Call with concerns or questions in the meantime.  Follow-Up Instructions: Return in about 1 week (around 06/19/2022).   Orders:  No orders of the defined types were placed in this encounter.  No orders of the defined types were placed in this encounter.   Imaging: No new imaging  PMFS History: Patient Active Problem List   Diagnosis Date Noted   Abscess of left hand 06/09/2022   Acute non-recurrent sinusitis 06/02/2022   Superficial foreign body of left hand 06/02/2022   Anxiety with depression 09/15/2021   ADHD (attention deficit hyperactivity disorder) 01/14/2016   Past Medical History:  Diagnosis Date   ADHD (attention deficit hyperactivity disorder), combined type 01/14/2016   Dysgraphia 01/14/2016    Family History  Problem Relation Age of Onset   Hypertension Mother    Diabetes Paternal Grandmother    Heart disease Paternal Grandfather     Past Surgical History:  Procedure Laterality  Date   I & D EXTREMITY Left 06/10/2022   Procedure: IRRIGATION AND DEBRIDEMENT EXTREMITY;  Surgeon: Tarry Kos, MD;  Location: [facility];  Service: Orthopedics;  Laterality: Left;   WISDOM TOOTH EXTRACTION  04/2021   Social History   Occupational History   Occupation: Artist: [employer]  Tobacco Use   Smoking status: Every Day    Packs/day: 0.00    Years: 5.00    Total pack years: 0.00    Types: E-cigarettes, Cigarettes    Start date: 05/29/2016    Last attempt to quit: 01/2021    Years since quitting: 1.3   Smokeless tobacco: Former    Quit date: 09/18/2016   Tobacco comments:    disposable - one per two week  Vaping Use   Vaping Use: Every day   Substances: Nicotine   Devices: Elf Bar  Substance and Sexual Activity   Alcohol use: Yes    Alcohol/week: 1.0 standard drink of alcohol    Types: 1 Cans of beer per week    Comment: socially   Drug use: Not Currently    Types: Marijuana   Sexual activity: Yes    Partners: Female    Birth control/protection: Condom

## 2022-06-14 ENCOUNTER — Other Ambulatory Visit: Payer: Self-pay | Admitting: Orthopaedic Surgery

## 2022-06-14 ENCOUNTER — Telehealth: Payer: Self-pay | Admitting: Orthopaedic Surgery

## 2022-06-14 MED ORDER — CEFADROXIL 500 MG PO CAPS: 500.0000 mg | ORAL_CAPSULE | Freq: Two times a day (BID) | ORAL | 0 refills | Status: AC

## 2022-06-14 NOTE — Telephone Encounter (Signed)
Called patient to inform him of culture results and that I would be sending in cefadroxil to take instead of bactrim.  Voicemail left and instructed patient to call back with any questions.

## 2022-06-15 LAB — AEROBIC/ANAEROBIC CULTURE W GRAM STAIN (SURGICAL/DEEP WOUND)

## 2022-06-17 ENCOUNTER — Ambulatory Visit: Payer: BC Managed Care – PPO | Admitting: Family Medicine

## 2022-06-18 ENCOUNTER — Ambulatory Visit (INDEPENDENT_AMBULATORY_CARE_PROVIDER_SITE_OTHER): Payer: BC Managed Care – PPO | Admitting: Orthopaedic Surgery

## 2022-06-18 ENCOUNTER — Encounter: Payer: Self-pay | Admitting: Orthopaedic Surgery

## 2022-06-18 DIAGNOSIS — S60552D Superficial foreign body of left hand, subsequent encounter: Secondary | ICD-10-CM

## 2022-06-18 DIAGNOSIS — L02512 Cutaneous abscess of left hand: Secondary | ICD-10-CM

## 2022-06-18 MED ORDER — MUPIROCIN 2 % EX OINT: 1.0000 | TOPICAL_OINTMENT | Freq: Two times a day (BID) | CUTANEOUS | 2 refills | Status: DC

## 2022-06-18 NOTE — Progress Notes (Signed)
   Post-Op Visit Note   Patient: Bruce Barton           Date of Birth: May 30, 2001           MRN: [id number] Visit Date: 06/18/2022 PCP: Bruce Salter, MD   Assessment & Plan:  Chief Complaint:  Chief Complaint  Patient presents with   Left Hand - Follow-up    I&D left hand 06/10/2022   Visit Diagnoses:  1. Superficial foreign body of left hand, subsequent encounter   2. Abscess of left hand     Plan: Bruce Barton comes in today for recheck of his left hand.  He is doing well has no complaints.  Did not check his voicemail that I left a few days ago to's change to cefadroxil.  Examination of left hand shows healing wound with a good superficial base.  At this point he can just put mupirocin ointment twice a day and cover with Band-Aid.  Prescription for mupirocin.  He will also pick up prescription for cefadroxil.  Follow-up as needed.  Follow-Up Instructions: No follow-ups on file.   Orders:  No orders of the defined types were placed in this encounter.  Meds ordered this encounter  Medications   mupirocin ointment (BACTROBAN) 2 %    Sig: Apply 1 Application topically 2 (two) times daily.    Dispense:  22 g    Refill:  2    Imaging: No results found.  PMFS History: Patient Active Problem List   Diagnosis Date Noted   Abscess of left hand 06/09/2022   Acute non-recurrent sinusitis 06/02/2022   Superficial foreign body of left hand 06/02/2022   Anxiety with depression 09/15/2021   ADHD (attention deficit hyperactivity disorder) 01/14/2016   Past Medical History:  Diagnosis Date   ADHD (attention deficit hyperactivity disorder), combined type 01/14/2016   Dysgraphia 01/14/2016    Family History  Problem Relation Age of Onset   Hypertension Mother    Diabetes Paternal Grandmother    Heart disease Paternal Grandfather     Past Surgical History:  Procedure Laterality Date   I & D EXTREMITY Left 06/10/2022   Procedure: IRRIGATION AND DEBRIDEMENT EXTREMITY;   Surgeon: Bruce Koyanagi, MD;  Location: [facility];  Service: Orthopedics;  Laterality: Left;   WISDOM TOOTH EXTRACTION  04/2021   Social History   Occupational History   Occupation: Radiation protection practitioner: [employer]  Tobacco Use   Smoking status: Every Day    Packs/day: 0.00    Years: 5.00    Total pack years: 0.00    Types: E-cigarettes, Cigarettes    Start date: 05/29/2016    Last attempt to quit: 01/2021    Years since quitting: 1.3   Smokeless tobacco: Former    Quit date: 09/18/2016   Tobacco comments:    disposable - one per two week  Vaping Use   Vaping Use: Every day   Substances: Nicotine   Devices: Elf Bar  Substance and Sexual Activity   Alcohol use: Yes    Alcohol/week: 1.0 standard drink of alcohol    Types: 1 Cans of beer per week    Comment: socially   Drug use: Not Currently    Types: Marijuana   Sexual activity: Yes    Partners: Female    Birth control/protection: Condom

## 2022-06-25 ENCOUNTER — Encounter: Payer: Self-pay | Admitting: Family Medicine

## 2022-06-25 ENCOUNTER — Ambulatory Visit: Payer: BC Managed Care – PPO | Admitting: Family Medicine

## 2022-06-25 VITALS — BP 110/78 | HR 90 | Temp 98.3°F | Wt 208.6 lb

## 2022-06-25 DIAGNOSIS — Z23 Encounter for immunization: Secondary | ICD-10-CM

## 2022-06-25 DIAGNOSIS — L02512 Cutaneous abscess of left hand: Secondary | ICD-10-CM | POA: Diagnosis not present

## 2022-06-25 DIAGNOSIS — F902 Attention-deficit hyperactivity disorder, combined type: Secondary | ICD-10-CM | POA: Diagnosis not present

## 2022-06-25 MED ORDER — LISDEXAMFETAMINE DIMESYLATE 50 MG PO CAPS: 50.0000 mg | ORAL_CAPSULE | Freq: Every day | ORAL | 0 refills | Status: DC

## 2022-06-25 NOTE — Progress Notes (Signed)
[facility] [address] [place] [address] Dept: [phone] Dept Fax: [phone]  Chronic Care Office Visit  Subjective:    Patient ID: Bruce Barton, male    DOB: 03-07-2001, 21 y.o..   MRN: [id number]  Chief Complaint  Patient presents with   Follow-up    6 mth F/U sugery on LT hand.    History of Present Illness:  Patient is in today for reassessment of chronic medical issues.  Bruce Barton has a history of ADHD diagnosed when he was in elementary school. He is currently managed on Vyvanse 50 mg daily. He finds this is helping him be successful at work. He did recently change jobs. he is now working in a rotoengraving business related to Horticulturist, commercial..  In early August, Bruce Barton was seen with an infection in his hand related to a splinter of wood. He had initial I&D and was started on antibiotics. However, the issue did not resolve and progressed to a deeper abscess. He was seen in early sept. by Dr. Janee Morn, who referred him on to Dr. Roda Shutters (orthopedics). He underwent operative irrigation and debridement.  He notes the hand is mostly healed at this point. He contues to have a firm area under the scar.  Past Medical History: Patient Active Problem List   Diagnosis Date Noted   Abscess of left hand 06/09/2022   Acute non-recurrent sinusitis 06/02/2022   Superficial foreign body of left hand 06/02/2022   Anxiety with depression 09/15/2021   ADHD (attention deficit hyperactivity disorder) 01/14/2016   Past Surgical History:  Procedure Laterality Date   I & D EXTREMITY Left 06/10/2022   Procedure: IRRIGATION AND DEBRIDEMENT EXTREMITY;  Surgeon: Tarry Kos, MD;  Location:  S[facility];  Service: Orthopedics;  Laterality: Left;   WISDOM TOOTH EXTRACTION  04/2021   Family History  Problem Relation Age of Onset   Hypertension Mother    Diabetes Paternal Grandmother    Heart disease  Paternal Grandfather    Outpatient Medications Prior to Visit  Medication Sig Dispense Refill   ondansetron (ZOFRAN) 4 MG tablet Take 1-2 tablets (4-8 mg total) by mouth every 8 (eight) hours as needed for nausea or vomiting. 20 tablet 0   lisdexamfetamine (VYVANSE) 50 MG capsule Take 1 capsule (50 mg total) by mouth daily. 30 capsule 0   mupirocin ointment (BACTROBAN) 2 % Apply 1 Application topically 2 (two) times daily. 22 g 2   sulfamethoxazole-trimethoprim (BACTRIM DS) 800-160 MG tablet Take 1 tablet by mouth 2 (two) times daily. 20 tablet 0   DOXYCYCLINE PO Take by mouth. (Patient not taking: Reported on 06/25/2022)     HYDROcodone-acetaminophen (NORCO) 5-325 MG tablet Take 1 tablet by mouth every 6 (six) hours as needed. (Patient not taking: Reported on 06/25/2022) 20 tablet 0   magic mouthwash (nystatin, diphenhydrAMINE, alum & mag hydroxide) suspension mixture Swish and spit 5 mLs 3 (three) times daily as needed for mouth pain. (Patient not taking: Reported on 06/25/2022) 240 mL 0   No facility-administered medications prior to visit.   Allergies  Allergen Reactions   Amoxicillin Er Hives      Objective:   Today's Vitals   06/25/22 1013  BP: 110/78  Pulse: 90  Temp: 98.3 F (36.8 C)  TempSrc: Temporal  SpO2: 97%  Weight: 208 lb 9.6 oz (94.6 kg)   Body mass index is 30.8 kg/m.   General: Well developed, well nourished. No acute distress.  Hand: There is a healing surgical wound in the palm between the thumb and index finger. There is a palpable ridge of scar tissue present. No current sign   of infection. Psych: Alert and oriented. Normal mood and affect.  Health Maintenance Due  Topic Date Due   HPV VACCINES (1 - Male 2-dose series) Never done     Assessment & Plan:   1. Attention deficit hyperactivity disorder (ADHD), combined type Stable on Vyvanse. I will continue his current dose and reassess him in 6 months.  - lisdexamfetamine (VYVANSE) 50 MG capsule; Take 1  capsule (50 mg total) by mouth daily.  Dispense: 30 capsule; Refill: 0  2. Abscess of left hand Healing. I recommend he do some massage with lotion over the scar to help flatten and mature this.  Return in about 6 months (around 12/24/2022) for Reassessment.   Haydee Salter, MD

## 2022-06-25 NOTE — Progress Notes (Signed)
follow

## 2022-08-03 ENCOUNTER — Other Ambulatory Visit: Payer: Self-pay | Admitting: Family Medicine

## 2022-08-03 DIAGNOSIS — F902 Attention-deficit hyperactivity disorder, combined type: Secondary | ICD-10-CM

## 2022-08-03 MED ORDER — LISDEXAMFETAMINE DIMESYLATE 50 MG PO CAPS: 50.0000 mg | ORAL_CAPSULE | Freq: Every day | ORAL | 0 refills | Status: DC

## 2022-09-02 ENCOUNTER — Other Ambulatory Visit: Payer: Self-pay | Admitting: Family Medicine

## 2022-09-02 DIAGNOSIS — F902 Attention-deficit hyperactivity disorder, combined type: Secondary | ICD-10-CM

## 2022-09-02 MED ORDER — LISDEXAMFETAMINE DIMESYLATE 50 MG PO CAPS: 50.0000 mg | ORAL_CAPSULE | Freq: Every day | ORAL | 0 refills | Status: DC

## 2022-09-02 NOTE — Telephone Encounter (Signed)
Refill request for  Vyvance 50 mg LR  08/03/22, #30,0 rf LOV  06/25/22 FOV  01/01/23 Please review and advise.  Thanks. Dm/cma

## 2022-09-08 ENCOUNTER — Telehealth: Payer: Self-pay | Admitting: Family Medicine

## 2022-09-08 DIAGNOSIS — F902 Attention-deficit hyperactivity disorder, combined type: Secondary | ICD-10-CM

## 2022-09-08 MED ORDER — LISDEXAMFETAMINE DIMESYLATE 50 MG PO CAPS: 50.0000 mg | ORAL_CAPSULE | Freq: Every day | ORAL | 0 refills | Status: DC

## 2022-09-08 NOTE — Telephone Encounter (Signed)
Pt's mom, French Ana called in stating her son's lisdexamfetamine (VYVANSE) 50 MG capsule [[id number]] is on back order, but if the script was written for Vyvanse only, no lisdexamfetamine on the name of the script. They can fill this.  [id number], [place] - [address], [place] [address] Phone: [phone]  Fax: [phone] DEA #: [id number]    [facility] @ [phone] (Mobile)

## 2022-09-08 NOTE — Addendum Note (Signed)
Addended by: Loyola Mast on: 09/08/2022 04:45 PM   Modules accepted: Orders

## 2022-09-09 ENCOUNTER — Telehealth: Payer: Self-pay

## 2022-09-09 ENCOUNTER — Other Ambulatory Visit (HOSPITAL_COMMUNITY): Payer: Self-pay

## 2022-09-09 NOTE — Telephone Encounter (Signed)
Patient Advocate Encounter   Received notification from [employer] that prior authorization is required for Vyvanse 50MG capsules. PA submitted and APPROVED on 09/09/2022.  Key [id number] PA#: [id number]  Effective: 09/09/22 - 09/27/2098  

## 2022-09-09 NOTE — Telephone Encounter (Signed)
Called pharmacy and they state that it did go through but it will be over $300.  Will call mom to see if they want to see if anyone else has the medication other that [facility]. Dm/cma

## 2022-09-09 NOTE — Telephone Encounter (Signed)
Spoke to patients mother, she states that since the generic is on back order, pharmacy needs a PA to fill the brand name, Vyvance.   Can you please and thank you do this for them? Thanks. Dm/cma

## 2022-09-09 NOTE — Telephone Encounter (Signed)
Patient Advocate Encounter   Received notification from [employer] that prior authorization is required for Vyvanse 50MG  capsules. PA submitted and APPROVED on 09/09/2022.  [facility] PA#: [facility]  Effective: 09/09/22 - 09/27/2098

## 2022-09-10 NOTE — Telephone Encounter (Signed)
Called patient's mother, French Ana and advised of information from pharmacy.  She will just have to pay this month for it.  Dm/cma

## 2022-10-06 ENCOUNTER — Other Ambulatory Visit: Payer: Self-pay | Admitting: Family Medicine

## 2022-10-06 DIAGNOSIS — F902 Attention-deficit hyperactivity disorder, combined type: Secondary | ICD-10-CM

## 2022-10-06 MED ORDER — LISDEXAMFETAMINE DIMESYLATE 50 MG PO CAPS: 50.0000 mg | ORAL_CAPSULE | Freq: Every day | ORAL | 0 refills | Status: DC

## 2022-10-06 NOTE — Telephone Encounter (Signed)
Refill request for  Vyvance 50 mg LR 09/08/22,#30,o rf LOV 06/25/22 FOV 01/01/23  Please review and advise. Thanks.  Dm/cma

## 2022-10-12 ENCOUNTER — Encounter: Payer: Self-pay | Admitting: Family Medicine

## 2022-10-12 DIAGNOSIS — F902 Attention-deficit hyperactivity disorder, combined type: Secondary | ICD-10-CM

## 2022-10-12 MED ORDER — LISDEXAMFETAMINE DIMESYLATE 50 MG PO CAPS: 50.0000 mg | ORAL_CAPSULE | Freq: Every day | ORAL | 0 refills | Status: DC

## 2022-11-10 ENCOUNTER — Encounter: Payer: Self-pay | Admitting: Family Medicine

## 2022-11-10 DIAGNOSIS — F902 Attention-deficit hyperactivity disorder, combined type: Secondary | ICD-10-CM

## 2022-11-10 MED ORDER — LISDEXAMFETAMINE DIMESYLATE 50 MG PO CAPS: 50.0000 mg | ORAL_CAPSULE | Freq: Every day | ORAL | 0 refills | Status: DC

## 2022-11-10 NOTE — Telephone Encounter (Signed)
Refill request for  Vyvance  50 mg LR  10/12/22, #30, 0 rf LOV 06/25/22 FOV  01/01/23  Please review and advise.  Thanks. Dm/cma

## 2022-11-11 NOTE — Telephone Encounter (Signed)
Lft VM  that RX was sent to the pharmacy. Dm/cma  

## 2022-12-13 ENCOUNTER — Encounter: Payer: Self-pay | Admitting: Family Medicine

## 2022-12-13 DIAGNOSIS — F902 Attention-deficit hyperactivity disorder, combined type: Secondary | ICD-10-CM

## 2022-12-14 MED ORDER — LISDEXAMFETAMINE DIMESYLATE 50 MG PO CAPS: 50.0000 mg | ORAL_CAPSULE | Freq: Every day | ORAL | 0 refills | Status: DC

## 2023-01-01 ENCOUNTER — Ambulatory Visit (INDEPENDENT_AMBULATORY_CARE_PROVIDER_SITE_OTHER): Payer: Self-pay | Admitting: Family Medicine

## 2023-01-01 ENCOUNTER — Encounter: Payer: Self-pay | Admitting: Family Medicine

## 2023-01-01 VITALS — BP 130/72 | HR 107 | Temp 98.6°F | Ht 69.0 in | Wt 220.1 lb

## 2023-01-01 DIAGNOSIS — F902 Attention-deficit hyperactivity disorder, combined type: Secondary | ICD-10-CM

## 2023-01-01 MED ORDER — ATOMOXETINE HCL 80 MG PO CAPS: 80.0000 mg | ORAL_CAPSULE | Freq: Every day | ORAL | 2 refills | Status: DC

## 2023-01-01 MED ORDER — ATOMOXETINE HCL 40 MG PO CAPS: 40.0000 mg | ORAL_CAPSULE | Freq: Every day | ORAL | 0 refills | Status: DC

## 2023-01-01 NOTE — Progress Notes (Signed)
  [facility] [address] [place] [address] Dept: [phone] Dept Fax: [phone]  Chronic Care Office Visit  Subjective:    Patient ID: Bruce Barton, male    DOB: 2001/04/11, 22 y.o..   MRN: [id number]  Chief Complaint  Patient presents with   Medical Management of Chronic Issues    6 month f/u ADHD. No concerns.     History of Present Illness:  Patient is in today for reassessment of chronic medical issues.  Mr. Babbit has a history of ADHD diagnosed when he was in elementary school. He is currently managed on Vyvanse 50 mg daily. He finds this is helping him be successful at work. However, he has had difficulty obtaining the medication. He asks about changing to Strattera. He notes his mother uses this and does not have issue with availability.  Past Medical History: Patient Active Problem List   Diagnosis Date Noted   Abscess of left hand 06/09/2022   Acute non-recurrent sinusitis 06/02/2022   Superficial foreign body of left hand 06/02/2022   Anxiety with depression 09/15/2021   ADHD (attention deficit hyperactivity disorder) 01/14/2016   Past Surgical History:  Procedure Laterality Date   I & D EXTREMITY Left 06/10/2022   Procedure: IRRIGATION AND DEBRIDEMENT EXTREMITY;  Surgeon: Tarry Kos, MD;  Location: [facility];  Service: Orthopedics;  Laterality: Left;   WISDOM TOOTH EXTRACTION  04/2021   Family History  Problem Relation Age of Onset   Hypertension Mother    Diabetes Paternal Grandmother    Heart disease Paternal Grandfather    Outpatient Medications Prior to Visit  Medication Sig Dispense Refill   lisdexamfetamine (VYVANSE) 50 MG capsule Take 1 capsule (50 mg total) by mouth daily. 30 capsule 0   ondansetron (ZOFRAN) 4 MG tablet Take 1-2 tablets (4-8 mg total) by mouth every 8 (eight) hours as needed for nausea or vomiting. 20 tablet 0   No facility-administered medications  prior to visit.   Allergies  Allergen Reactions   Amoxicillin Er Hives   Objective:   Today's Vitals   01/01/23 0818  BP: 130/72  Pulse: (!) 107  Temp: 98.6 F (37 C)  TempSrc: Temporal  SpO2: 98%  Weight: 220 lb 1 oz (99.8 kg)  Height: 5\' 9"  (1.753 m)   Body mass index is 32.5 kg/m.   General: Well developed, well nourished. No acute distress. Psych: Alert and oriented. Normal mood and affect.  Health Maintenance Due  Topic Date Due   HPV VACCINES (1 - Male 2-dose series) Never done     Assessment & Plan:   Problem List Items Addressed This Visit       Other   ADHD (attention deficit hyperactivity disorder) - Primary (Chronic)    We will plan to give him a trial on Strattera. I reviewed titrating up to 80 mg daily. I will plan to see him in 6 weeks to reassess.      Relevant Medications   atomoxetine (STRATTERA) 40 MG capsule   atomoxetine (STRATTERA) 80 MG capsule    Return in about 6 weeks (around 02/12/2023) for Reassessment.   Loyola Mast, MD

## 2023-01-01 NOTE — Assessment & Plan Note (Signed)
We will plan to give him a trial on Strattera. I reviewed titrating up to 80 mg daily. I will plan to see him in 6 weeks to reassess.

## 2023-02-12 ENCOUNTER — Ambulatory Visit: Payer: 59 | Admitting: Family Medicine

## 2023-02-12 ENCOUNTER — Encounter: Payer: Self-pay | Admitting: Family Medicine

## 2023-02-12 DIAGNOSIS — F902 Attention-deficit hyperactivity disorder, combined type: Secondary | ICD-10-CM | POA: Diagnosis not present

## 2023-02-12 MED ORDER — ATOMOXETINE HCL 80 MG PO CAPS: 80.0000 mg | ORAL_CAPSULE | Freq: Every day | ORAL | 3 refills | Status: AC

## 2023-02-12 NOTE — Assessment & Plan Note (Signed)
Doing well. Plan to continue Strattera 80 mg daily.

## 2023-02-12 NOTE — Progress Notes (Signed)
  [facility] [address] [place] [address] Dept: [phone] Dept Fax: [phone]  Chronic Care Office Visit  Subjective:    Patient ID: Bruce Barton, male    DOB: [birth year], 22 y.o..   MRN: [id number]  Chief Complaint  Patient presents with   Medical Management of Chronic Issues    6 week f/u. No concerns.    History of Present Illness:  Patient is in today for reassessment of chronic medical issues.  Mr. Bufkin has a history of ADHD diagnosed when he was in elementary school. He had been managed on Vyvanse 50 mg daily. Although he found this helped him be successful at work, he had difficulty obtaining the medication. At his last visit, we switched him to Strattera. He has now titrated up on this to 80 mg daily. He finds this is being effective for him. He denies any side effects.  Past Medical History: Patient Active Problem List   Diagnosis Date Noted   Acute non-recurrent sinusitis 06/02/2022   Anxiety with depression 09/15/2021   ADHD (attention deficit hyperactivity disorder) 01/14/2016   Past Surgical History:  Procedure Laterality Date   I & D EXTREMITY Left 06/10/2022   Procedure: IRRIGATION AND DEBRIDEMENT EXTREMITY;  Surgeon: Tarry Kos, MD;  Location: [facility];  Service: Orthopedics;  Laterality: Left;   WISDOM TOOTH EXTRACTION  04/2021   Family History  Problem Relation Age of Onset   Hypertension Mother    Diabetes Paternal Grandmother    Heart disease Paternal Grandfather    Outpatient Medications Prior to Visit  Medication Sig Dispense Refill   atomoxetine (STRATTERA) 80 MG capsule Take 1 capsule (80 mg total) by mouth daily. 30 capsule 2   atomoxetine (STRATTERA) 40 MG capsule Take 1 capsule (40 mg total) by mouth daily. 3 capsule 0   lisdexamfetamine (VYVANSE) 50 MG capsule Take 1 capsule (50 mg total) by mouth daily. 30 capsule 0   No facility-administered medications  prior to visit.   Allergies  Allergen Reactions   Amoxicillin Er Hives   Objective:   Today's Vitals   02/12/23 0808  BP: 124/76  Pulse: 90  Temp: 98.6 F (37 C)  TempSrc: Temporal  SpO2: 98%  Weight: 222 lb 6.4 oz (100.9 kg)  Height: 5\' 9"  (1.753 m)   Body mass index is 32.84 kg/m.   General: Well developed, well nourished. No acute distress. Psych: Alert and oriented. Normal mood and affect.  Health Maintenance Due  Topic Date Due   HPV VACCINES (1 - Male 2-dose series) Never done     Assessment & Plan:   Problem List Items Addressed This Visit       Other   ADHD (attention deficit hyperactivity disorder) (Chronic)    Doing well. Plan to continue Strattera 80 mg daily.      Relevant Medications   atomoxetine (STRATTERA) 80 MG capsule    Return in about 3 months (around 05/15/2023) for Reassessment.   Loyola Mast, MD

## 2023-03-17 ENCOUNTER — Encounter: Payer: Self-pay | Admitting: Internal Medicine

## 2023-03-17 ENCOUNTER — Ambulatory Visit: Payer: 59 | Admitting: Internal Medicine

## 2023-03-17 VITALS — BP 122/80 | HR 84 | Temp 98.6°F | Ht 69.0 in | Wt 225.2 lb

## 2023-03-17 DIAGNOSIS — J019 Acute sinusitis, unspecified: Secondary | ICD-10-CM | POA: Diagnosis not present

## 2023-03-17 LAB — POCT INFLUENZA A/B
Influenza A, POC: NEGATIVE
Influenza B, POC: NEGATIVE

## 2023-03-17 LAB — POC COVID19 BINAXNOW: SARS Coronavirus 2 Ag: NEGATIVE

## 2023-03-17 MED ORDER — AZITHROMYCIN 250 MG PO TABS: ORAL_TABLET | ORAL | 0 refills | Status: AC

## 2023-03-17 NOTE — Progress Notes (Signed)
[facility] [address] [place] [address] Dept: [phone] Dept Fax: [phone]  Acute Care Office Visit  Subjective:   ARMEND BAUMHARDT 2000/12/14 03/17/2023  Chief Complaint  Patient presents with   Sinus Problem    Fever, chills, sore throat  Green and blood coming out of nose    HPI: URI SYMPTOMS: Onset: 1 day ago.    Fever: yes 99.75F, no fever today  Chills: yes Headache: yes (sinus) Runny nose: yes Nasal congestion: yes  Sinus pressure and pain: yes Cough: no Ear pain: yes - 1 week ago but not returned Sore throat: yes  Treatments tried: tylenol, advil - which did help with sinus headache and fever   Recent sick contacts: unknown   Hx of sinus and ear infections.    The following portions of the patient's history were reviewed and updated as appropriate: past medical history, past surgical history, family history, social history, allergies, medications, and problem list.   Patient Active Problem List   Diagnosis Date Noted   Acute non-recurrent sinusitis 06/02/2022   Anxiety with depression 09/15/2021   ADHD (attention deficit hyperactivity disorder) 01/14/2016   Past Medical History:  Diagnosis Date   ADHD (attention deficit hyperactivity disorder), combined type 01/14/2016   Dysgraphia 01/14/2016   Past Surgical History:  Procedure Laterality Date   I & D EXTREMITY Left 06/10/2022   Procedure: IRRIGATION AND DEBRIDEMENT EXTREMITY;  Surgeon: Tarry Kos, MD;  Location: [facility];  Service: Orthopedics;  Laterality: Left;   WISDOM TOOTH EXTRACTION  04/2021   Family History  Problem Relation Age of Onset   Hypertension Mother    Diabetes Paternal Grandmother    Heart disease Paternal Grandfather    Outpatient Medications Prior to Visit  Medication Sig Dispense Refill   atomoxetine (STRATTERA) 80 MG capsule Take 1 capsule (80 mg total) by mouth daily. 90 capsule 3    No facility-administered medications prior to visit.   Allergies  Allergen Reactions   Amoxicillin Er Hives     ROS: A complete ROS was performed with pertinent positives/negatives noted in the HPI. The remainder of the ROS are negative.    Objective:   Today's Vitals   03/17/23 1304  BP: 122/80  Pulse: 84  Temp: 98.6 F (37 C)  TempSrc: Temporal  SpO2: 99%  Weight: 225 lb 3.2 oz (102.2 kg)  Height: 5\' 9"  (1.753 m)    GENERAL: Well-appearing, in NAD. Well nourished.  SKIN: Pink, warm and dry. No rash, lesion, ulceration, or ecchymoses.  HEENT:    HEAD: Normocephalic, non-traumatic.  EYES: Conjunctive pink without exudate. PERRL, EOMI.  EARS: External ear w/o redness, swelling, masses, or lesions. EAC clear. TM's intact, translucent w/o bulging, appropriate landmarks visualized.  NOSE: Septum midline w/o deformity. Nares patent, mucosa pink and non-inflamed w/o drainage. (+)maxillary sinus pressure THROAT: Uvula midline. Oropharynx clear. Tonsils non-inflamed w/o exudate. Mucus membranes pink and moist. ' NECK: Trachea midline. Full ROM w/o pain or tenderness. No lymphadenopathy.  RESPIRATORY: Chest wall symmetrical. Respirations even and non-labored. Breath sounds clear to auscultation bilaterally.  CARDIAC: S1, S2 present, regular rate and rhythm. Peripheral pulses 2+ bilaterally.  EXTREMITIES: Without clubbing, cyanosis, or edema.  NEUROLOGIC: Steady, even gait.  PSYCH/MENTAL STATUS: Alert, oriented x 3. Cooperative, appropriate mood and affect.    Results for orders placed or performed in visit on 03/17/23  POC COVID-19 BinaxNow  Result Value Ref Range   SARS Coronavirus 2 Ag Negative  Negative  POCT Influenza A/B  Result Value Ref Range   Influenza A, POC Negative Negative   Influenza B, POC Negative Negative      Assessment & Plan:  1. Acute non-recurrent sinusitis, unspecified location - POC COVID-19 BinaxNow - POCT Influenza A/B - azithromycin  (ZITHROMAX) 250 MG tablet; Take 2 tablets on day 1, then 1 tablet daily on days 2 through 5  Dispense: 6 tablet; Refill: 0 -Use over-the-counter Flonase: 2 nasal sprays on each side of the nose in the morning until you feel better -Use OTC Astepro 2 nasal sprays on each side of the nose twice daily until better Can use decongestant Pseudoephedrine or phenylephrine as directed on the medication packaging.  - watch and wait antibiotic provided and instructed patient when to take   Meds ordered this encounter  Medications   azithromycin (ZITHROMAX) 250 MG tablet    Sig: Take 2 tablets on day 1, then 1 tablet daily on days 2 through 5    Dispense:  6 tablet    Refill:  0    Order Specific Question:   Supervising Provider    Answer:   Garnette Gunner [[id number]]   Lab Orders         POC COVID-19 BinaxNow         POCT Influenza A/B     No images are attached to the encounter or orders placed in the encounter.  Return if symptoms worsen or fail to improve.   Salvatore Decent, FNP

## 2023-03-17 NOTE — Patient Instructions (Addendum)
  Rest, drink plenty of fluids.  Tylenol for fever, body aches.   For nasal congestion: -Use over-the-counter Flonase: 2 nasal sprays on each side of the nose in the morning until you feel better  -Use OTC Astepro 2 nasal sprays on each side of the nose twice daily until better  Can use decongestant Pseudoephedrine or phenylephrine as directed on the medication packaging.   antibiotic as prescribed  if symptoms do not improve after 5-7 days  Call if not gradually better over the next  10 days  Call anytime if the symptoms are severe, you have high fever, short of breath, chest pain

## 2023-04-28 ENCOUNTER — Encounter (INDEPENDENT_AMBULATORY_CARE_PROVIDER_SITE_OTHER): Payer: Self-pay

## 2023-05-14 ENCOUNTER — Telehealth: Payer: Self-pay | Admitting: Family Medicine

## 2023-05-14 ENCOUNTER — Ambulatory Visit: Payer: 59 | Admitting: Family Medicine

## 2023-05-14 NOTE — Telephone Encounter (Signed)
Pt responded "text no". Cancelled from schedule and LVM to reschedule (temi)

## 2023-05-14 NOTE — Telephone Encounter (Signed)
05/14/23 - No show letter sent.

## 2023-10-18 ENCOUNTER — Ambulatory Visit: Payer: 59 | Admitting: Internal Medicine

## 2023-10-18 ENCOUNTER — Encounter: Payer: Self-pay | Admitting: Internal Medicine

## 2023-10-18 VITALS — BP 122/80 | HR 113 | Temp 99.5°F | Ht 69.0 in | Wt 233.2 lb

## 2023-10-18 DIAGNOSIS — J101 Influenza due to other identified influenza virus with other respiratory manifestations: Secondary | ICD-10-CM

## 2023-10-18 DIAGNOSIS — R112 Nausea with vomiting, unspecified: Secondary | ICD-10-CM | POA: Diagnosis not present

## 2023-10-18 LAB — POCT INFLUENZA A/B
Influenza A, POC: POSITIVE — AB
Influenza B, POC: NEGATIVE

## 2023-10-18 LAB — POCT RAPID STREP A (OFFICE): Rapid Strep A Screen: NEGATIVE

## 2023-10-18 LAB — POC COVID19 BINAXNOW: SARS Coronavirus 2 Ag: NEGATIVE

## 2023-10-18 MED ORDER — OSELTAMIVIR PHOSPHATE 75 MG PO CAPS: 75.0000 mg | ORAL_CAPSULE | Freq: Two times a day (BID) | ORAL | 0 refills | Status: AC

## 2023-10-18 MED ORDER — ONDANSETRON 4 MG PO TBDP: 4.0000 mg | ORAL_TABLET | Freq: Three times a day (TID) | ORAL | 0 refills | Status: AC | PRN

## 2023-10-18 MED ORDER — PROMETHAZINE-DM 6.25-15 MG/5ML PO SYRP: 5.0000 mL | ORAL_SOLUTION | Freq: Four times a day (QID) | ORAL | 0 refills | Status: AC | PRN

## 2023-10-18 MED ORDER — ONDANSETRON HCL 4 MG PO TABS
4.0000 mg | ORAL_TABLET | Freq: Once | ORAL | Status: AC
  Administered 2023-10-18: 4 mg via ORAL

## 2023-10-18 NOTE — Patient Instructions (Signed)
Drink plenty of fluids

## 2023-10-18 NOTE — Progress Notes (Signed)
[facility] [address] [place] [address] Dept: [phone] Dept Fax: [phone]  Acute Care Office Visit  Subjective:   Bruce Barton 2000-10-07 10/18/2023  Chief Complaint  Patient presents with   Cough    Started yesterday    Fever    100.3 yesterday Nausea and sinus pressure      HPI: Discussed the use of AI scribe software for clinical note transcription with the patient, who gave verbal consent to proceed.  History of Present Illness   The patient, with a history of exposure to a friend with a high fever over the weekend, presents with flu like symptoms. He reports onset of symptoms 1 day ago, starting with coughing and sinus issues. The symptoms rapidly progressed, with the patient developing a fever of 103 degrees Fahrenheit, severe chills, and nausea. He also reports chest pressure when coughing, and generalized body aches. The patient has been managing symptoms with over-the-counter analgesics (tylenol/ibuprofen), which have helped with the body aches but not the fever. He also reports a sore throat, which is most noticeable when coughing and swallowing, and describes a sensation of something stuck in the back of his throat.       The following portions of the patient's history were reviewed and updated as appropriate: past medical history, past surgical history, family history, social history, allergies, medications, and problem list.   Patient Active Problem List   Diagnosis Date Noted   Acute non-recurrent sinusitis 06/02/2022   Anxiety with depression 09/15/2021   ADHD (attention deficit hyperactivity disorder) 01/14/2016   Past Medical History:  Diagnosis Date   ADHD (attention deficit hyperactivity disorder), combined type 01/14/2016   Dysgraphia 01/14/2016   Past Surgical History:  Procedure Laterality Date   I & D EXTREMITY Left 06/10/2022   Procedure: IRRIGATION AND DEBRIDEMENT EXTREMITY;   Surgeon: Tarry Kos, MD;  Location: [facility];  Service: Orthopedics;  Laterality: Left;   WISDOM TOOTH EXTRACTION  04/2021   Family History  Problem Relation Age of Onset   Hypertension Mother    Diabetes Paternal Grandmother    Heart disease Paternal Grandfather     Current Outpatient Medications:    atomoxetine (STRATTERA) 80 MG capsule, Take 1 capsule (80 mg total) by mouth daily., Disp: 90 capsule, Rfl: 3   ondansetron (ZOFRAN-ODT) 4 MG disintegrating tablet, Take 1 tablet (4 mg total) by mouth every 8 (eight) hours as needed for nausea or vomiting., Disp: 20 tablet, Rfl: 0   oseltamivir (TAMIFLU) 75 MG capsule, Take 1 capsule (75 mg total) by mouth 2 (two) times daily., Disp: 10 capsule, Rfl: 0   promethazine-dextromethorphan (PROMETHAZINE-DM) 6.25-15 MG/5ML syrup, Take 5 mLs by mouth 4 (four) times daily as needed for cough., Disp: 180 mL, Rfl: 0 Allergies  Allergen Reactions   Amoxicillin Er Hives     ROS: A complete ROS was performed with pertinent positives/negatives noted in the HPI. The remainder of the ROS are negative.    Objective:   Today's Vitals   10/18/23 1005  BP: 122/80  Pulse: (!) 113  Temp: 99.5 F (37.5 C)  TempSrc: Temporal  SpO2: 97%  Weight: 233 lb 3.2 oz (105.8 kg)  Height: 5\' 9"  (1.753 m)    GENERAL: ill-appearing, non-toxic. in NAD. Well nourished.  SKIN: Pink, warm and dry. No rash.  HEENT:    HEAD: Normocephalic, non-traumatic.  EYES: Conjunctive pink without exudate. EARS: External ear w/o redness, swelling, masses, or lesions. EAC  clear. TM's intact, translucent w/o bulging, appropriate landmarks visualized.  NOSE: Septum midline w/o deformity. Nares patent, mucosa pink and non-inflamed w/o drainage. Frontal sinus tenderness THROAT: Uvula midline. Oropharynx with some erythema. Tonsils non-inflamed w/o exudate . Mucus membranes pink and moist.  NECK: Trachea midline. Full ROM w/o pain or tenderness. No lymphadenopathy.   RESPIRATORY: Chest wall symmetrical. Respirations even and non-labored. Breath sounds clear to auscultation bilaterally. (+)cough CARDIAC: S1, S2 present, tachycardic. Peripheral pulses 2+ bilaterally.  EXTREMITIES: Without clubbing, cyanosis, or edema.  NEUROLOGIC: Steady, even gait.  PSYCH/MENTAL STATUS: Alert, oriented x 3. Cooperative, appropriate mood and affect.    Results for orders placed or performed in visit on 10/18/23  POCT Influenza A/B  Result Value Ref Range   Influenza A, POC Positive (A) Negative   Influenza B, POC Negative Negative  POC COVID-19 BinaxNow  Result Value Ref Range   SARS Coronavirus 2 Ag Negative Negative  POCT rapid strep A  Result Value Ref Range   Rapid Strep A Screen Negative Negative      Assessment & Plan:  Assessment and Plan    Influenza A Positive flu test with symptoms of fever, cough, sinus congestion, body aches, and nausea. No vomiting. Sore throat with no white splotches. -Start Tamiflu, Zofran ODT for nausea, and Phenergan DM for cough as needed. -Advise rest and hydration. -Provide work note and advise patient to remain fever-free for 24 hours without medication and symptoms improving before returning to work. -Advise patient to follow up in the office or go to the local emergency room if symptoms worsen.  Nausea and Vomiting  - Patient did have 1 episode of vomiting in office , was given 1 tablet of Zofran  - Rx prescribed for Zofran 4mg  ODT q8hr PRN  - bland diet , drink plenty of fluids      Meds ordered this encounter  Medications   ondansetron (ZOFRAN) tablet 4 mg   oseltamivir (TAMIFLU) 75 MG capsule    Sig: Take 1 capsule (75 mg total) by mouth 2 (two) times daily.    Dispense:  10 capsule    Refill:  0    Supervising Provider:   Garnette Gunner [[id number]]   ondansetron (ZOFRAN-ODT) 4 MG disintegrating tablet    Sig: Take 1 tablet (4 mg total) by mouth every 8 (eight) hours as needed for nausea or vomiting.     Dispense:  20 tablet    Refill:  0    Supervising Provider:   Garnette Gunner [[id number]]   promethazine-dextromethorphan (PROMETHAZINE-DM) 6.25-15 MG/5ML syrup    Sig: Take 5 mLs by mouth 4 (four) times daily as needed for cough.    Dispense:  180 mL    Refill:  0    Supervising Provider:   Garnette Gunner [[id number]]   Orders Placed This Encounter  Procedures   POCT Influenza A/B   POC COVID-19 BinaxNow    Previously tested for COVID-19:   No    Resident in a congregate (group) care setting:   Unknown    Employed in healthcare setting:   Unknown   POCT rapid strep A   Lab Orders         POCT Influenza A/B         POC COVID-19 BinaxNow         POCT rapid strep A     No images are attached to the encounter or orders placed in the encounter.  Return if symptoms worsen or fail  to improve.   Salvatore Decent, FNP

## 2023-10-19 ENCOUNTER — Encounter: Payer: Self-pay | Admitting: Internal Medicine

## 2023-12-09 ENCOUNTER — Other Ambulatory Visit: Payer: Self-pay

## 2023-12-09 ENCOUNTER — Emergency Department (HOSPITAL_BASED_OUTPATIENT_CLINIC_OR_DEPARTMENT_OTHER)
Admission: EM | Admit: 2023-12-09 | Discharge: 2023-12-09 | Disposition: A | Discharge status: Home / Self Care | Attending: Emergency Medicine | Admitting: Emergency Medicine

## 2023-12-09 ENCOUNTER — Encounter (HOSPITAL_BASED_OUTPATIENT_CLINIC_OR_DEPARTMENT_OTHER): Payer: Self-pay | Admitting: Emergency Medicine

## 2023-12-09 DIAGNOSIS — K529 Noninfective gastroenteritis and colitis, unspecified: Secondary | ICD-10-CM | POA: Insufficient documentation

## 2023-12-09 DIAGNOSIS — R1110 Vomiting, unspecified: Secondary | ICD-10-CM | POA: Diagnosis present

## 2023-12-09 LAB — CBC
HCT: 42.4 % (ref 39.0–52.0)
Hemoglobin: 14.8 g/dL (ref 13.0–17.0)
MCH: 30.1 pg (ref 26.0–34.0)
MCHC: 34.9 g/dL (ref 30.0–36.0)
MCV: 86.4 fL (ref 80.0–100.0)
Platelets: 292 10*3/uL (ref 150–400)
RBC: 4.91 MIL/uL (ref 4.22–5.81)
RDW: 12.4 % (ref 11.5–15.5)
WBC: 11.1 10*3/uL — ABNORMAL HIGH (ref 4.0–10.5)
nRBC: 0 % (ref 0.0–0.2)

## 2023-12-09 LAB — COMPREHENSIVE METABOLIC PANEL
ALT: 32 U/L (ref 0–44)
AST: 25 U/L (ref 15–41)
Albumin: 4 g/dL (ref 3.5–5.0)
Alkaline Phosphatase: 87 U/L (ref 38–126)
Anion gap: 14 (ref 5–15)
BUN: 19 mg/dL (ref 6–20)
CO2: 19 mmol/L — ABNORMAL LOW (ref 22–32)
Calcium: 8.8 mg/dL — ABNORMAL LOW (ref 8.9–10.3)
Chloride: 102 mmol/L (ref 98–111)
Creatinine, Ser: 0.97 mg/dL (ref 0.61–1.24)
GFR, Estimated: >60 mL/min (ref >60)
Glucose, Bld: 104 mg/dL — ABNORMAL HIGH (ref 70–99)
Potassium: 3.5 mmol/L (ref 3.5–5.1)
Sodium: 135 mmol/L (ref 135–145)
Total Bilirubin: 0.8 mg/dL (ref 0.0–1.2)
Total Protein: 8.1 g/dL (ref 6.5–8.1)

## 2023-12-09 LAB — RESP PANEL BY RT-PCR (RSV, FLU A&B, COVID)  RVPGX2
Influenza A by PCR: NEGATIVE
Influenza B by PCR: NEGATIVE
Resp Syncytial Virus by PCR: NEGATIVE
SARS Coronavirus 2 by RT PCR: NEGATIVE

## 2023-12-09 LAB — URINALYSIS, ROUTINE W REFLEX MICROSCOPIC
Bilirubin Urine: NEGATIVE
Glucose, UA: NEGATIVE mg/dL
Hgb urine dipstick: NEGATIVE
Ketones, ur: NEGATIVE mg/dL
Leukocytes,Ua: NEGATIVE
Nitrite: NEGATIVE
Protein, ur: 100 mg/dL — AB
Specific Gravity, Urine: >=1.03 (ref 1.005–1.030)
pH: 6 (ref 5.0–8.0)

## 2023-12-09 LAB — URINALYSIS, MICROSCOPIC (REFLEX)
RBC / HPF: NONE SEEN RBC/hpf (ref 0–5)
WBC, UA: NONE SEEN WBC/hpf (ref 0–5)

## 2023-12-09 LAB — LIPASE, BLOOD: Lipase: 22 U/L (ref 11–51)

## 2023-12-09 MED ORDER — ACETAMINOPHEN 325 MG PO TABS
650.0000 mg | ORAL_TABLET | Freq: Once | ORAL | Status: AC | PRN
  Administered 2023-12-09: 650 mg via ORAL

## 2023-12-09 MED ORDER — ONDANSETRON HCL 4 MG/2ML IJ SOLN
4.0000 mg | Freq: Once | INTRAMUSCULAR | Status: AC
  Administered 2023-12-09: 4 mg via INTRAVENOUS
  Filled 2023-12-09: qty 2

## 2023-12-09 MED ORDER — ACETAMINOPHEN 325 MG PO TABS
ORAL_TABLET | ORAL | Status: AC
  Filled 2023-12-09: qty 2

## 2023-12-09 MED ORDER — LOPERAMIDE HCL 2 MG PO TABS: 2.0000 mg | ORAL_TABLET | Freq: Four times a day (QID) | ORAL | 0 refills | Status: AC | PRN

## 2023-12-09 MED ORDER — SODIUM CHLORIDE 0.9 % IV BOLUS
1000.0000 mL | Freq: Once | INTRAVENOUS | Status: AC
  Administered 2023-12-09: 1000 mL via INTRAVENOUS

## 2023-12-09 NOTE — ED Provider Notes (Addendum)
 [facility] Provider Note   CSN: [id number] Arrival date & time: 12/09/23  1645     History  Chief Complaint  Patient presents with   Emesis   Diarrhea    Bruce Barton is a 23 y.o. male.  Patient with acute onset of vomiting at 3:30 in the morning.  Multiple episodes of vomiting continuing into this afternoon but now starting to taper.  But still with some vomiting.  Coffee-ground but no bright red blood also associated with diarrhea no blood in the diarrhea.  Patient was trying Zofran at home and it was not helping very much.  Past medical history significant for attention deficit disorder.  Patient's everyday smoker since 2017.  Not associated with significant abdominal pain.  Patient temp on arrival was 101.6 he did get Tylenol.  Temp now is 99.2.  Heart rate was originally 140 now is 122 respirations 18 blood pressure 131/87 oxygen saturation 99% on room air.       Home Medications Prior to Admission medications   Medication Sig Start Date End Date Taking? Authorizing Provider  atomoxetine (STRATTERA) 80 MG capsule Take 1 capsule (80 mg total) by mouth daily. 02/12/23   Loyola Mast, MD  ondansetron (ZOFRAN-ODT) 4 MG disintegrating tablet Take 1 tablet (4 mg total) by mouth every 8 (eight) hours as needed for nausea or vomiting. 10/18/23   Salvatore Decent, FNP  oseltamivir (TAMIFLU) 75 MG capsule Take 1 capsule (75 mg total) by mouth 2 (two) times daily. 10/18/23   Salvatore Decent, FNP  promethazine-dextromethorphan (PROMETHAZINE-DM) 6.25-15 MG/5ML syrup Take 5 mLs by mouth 4 (four) times daily as needed for cough. 10/18/23   Salvatore Decent, FNP      Allergies    Amoxicillin er    Review of Systems   Review of Systems  Constitutional:  Negative for chills and fever.  HENT:  Negative for ear pain and sore throat.   Eyes:  Negative for pain and visual disturbance.  Respiratory:  Negative for cough and shortness of breath.    Cardiovascular:  Negative for chest pain and palpitations.  Gastrointestinal:  Positive for diarrhea, nausea and vomiting. Negative for abdominal pain.  Genitourinary:  Negative for dysuria and hematuria.  Musculoskeletal:  Negative for arthralgias and back pain.  Skin:  Negative for color change and rash.  Neurological:  Negative for seizures and syncope.  All other systems reviewed and are negative.   Physical Exam Updated Vital Signs BP 131/87 (BP Location: Right Arm)   Pulse (!) 122   Temp 99.2 F (37.3 C) (Oral)   Resp 18   Ht 1.753 m (5\' 9" )   Wt 104.3 kg   SpO2 99%   BMI 33.97 kg/m  Physical Exam Vitals and nursing note reviewed.  Constitutional:      General: He is not in acute distress.    Appearance: Normal appearance. He is well-developed.  HENT:     Head: Normocephalic and atraumatic.     Mouth/Throat:     Mouth: Mucous membranes are dry.  Eyes:     Extraocular Movements: Extraocular movements intact.     Conjunctiva/sclera: Conjunctivae normal.     Pupils: Pupils are equal, round, and reactive to light.  Cardiovascular:     Rate and Rhythm: Normal rate and regular rhythm.     Heart sounds: No murmur heard. Pulmonary:     Effort: Pulmonary effort is normal. No respiratory distress.     Breath sounds:  Normal breath sounds.  Abdominal:     General: There is no distension.     Palpations: Abdomen is soft.     Tenderness: There is no abdominal tenderness. There is no guarding.  Musculoskeletal:        General: No swelling.     Cervical back: Neck supple.  Skin:    General: Skin is warm and dry.     Capillary Refill: Capillary refill takes less than 2 seconds.  Neurological:     General: No focal deficit present.     Mental Status: He is alert and oriented to person, place, and time.  Psychiatric:        Mood and Affect: Mood normal.     ED Results / Procedures / Treatments   Labs (all labs ordered are listed, but only abnormal results are  displayed) Labs Reviewed  COMPREHENSIVE METABOLIC PANEL - Abnormal; Notable for the following components:      Result Value   CO2 19 (*)    Glucose, Bld 104 (*)    Calcium 8.8 (*)    All other components within normal limits  CBC - Abnormal; Notable for the following components:   WBC 11.1 (*)    All other components within normal limits  RESP PANEL BY RT-PCR (RSV, FLU A&B, COVID)  RVPGX2  LIPASE, BLOOD  URINALYSIS, ROUTINE W REFLEX MICROSCOPIC    EKG EKG Interpretation Date/Time:  Thursday December 09 2023 17:04:03 EDT Ventricular Rate:  135 PR Interval:  144 QRS Duration:  96 QT Interval:  287 QTC Calculation: 431 R Axis:   99  Text Interpretation: Sinus tachycardia Borderline right axis deviation Low voltage, precordial leads Consider anterior infarct No previous ECGs available Confirmed by Vanetta Mulders [phone]) on 12/09/2023 8:25:16 PM  Radiology No results found.  Procedures Procedures    Medications Ordered in ED Medications  acetaminophen (TYLENOL) 325 MG tablet (has no administration in time range)  sodium chloride 0.9 % bolus 1,000 mL (has no administration in time range)  ondansetron (ZOFRAN) injection 4 mg (has no administration in time range)  acetaminophen (TYLENOL) tablet 650 mg (650 mg Oral Given 12/09/23 1704)    ED Course/ Medical Decision Making/ A&P                                 Medical Decision Making Amount and/or Complexity of Data Reviewed Labs: ordered.  Risk OTC drugs. Prescription drug management.   Patient's complete metabolic panel significant for CO2 of 19 blood sugar 104 renal function completely normal with a GFR greater than 60.  Liver function test normal.  Anion gap normal.  White blood cell count elevated a little bit 11.1.  Platelets are normal.  Urinalysis is pending but not necessary.  Lipase was 22 respiratory panel negative.  Symptoms consistent with a viral gastroenteritis.  Showing signs of some dehydration will give  a liter of fluid and some IV Zofran.  Vomiting is starting to taper.  Patient should be stable for discharge home has Zofran at home will make sure he has enough and could do Imodium A-D.  Work note will be provided.  Patient nontoxic no acute distress  Patient feeling much better. Final Clinical Impression(s) / ED Diagnoses Final diagnoses:  Gastroenteritis    Rx / DC Orders ED Discharge Orders     None         Vanetta Mulders, MD 12/09/23 2044  Vanetta Mulders, MD 12/09/23 [phone]

## 2023-12-09 NOTE — ED Notes (Signed)
 ED Provider at bedside.

## 2023-12-09 NOTE — ED Triage Notes (Signed)
 Pt POV steady gait- reports emesis, diarrhea since 0330 today. Reports coffee ground like emesis, denies hx of ulcers.   Took zofran appx 1245 today, ineffective.

## 2023-12-09 NOTE — Discharge Instructions (Addendum)
 Would recommend just small amounts of liquids with sugar once you are tolerating liquids well and go to bland diet.  Usually Zofran dissolvable tablets that she have as needed for any nausea or vomiting.  And also can pick up Imodium A-D for the diarrhea.  Work note provided.  Would expect you to continue to improve return for any new or worse symptoms.

## 2023-12-09 NOTE — Progress Notes (Signed)
 This encounter was created in error - please disregard.

## 2023-12-10 ENCOUNTER — Telehealth: Payer: Self-pay

## 2023-12-10 ENCOUNTER — Encounter: Admitting: Internal Medicine

## 2023-12-10 NOTE — Telephone Encounter (Signed)
 error

## 2023-12-10 NOTE — Transitions of Care (Post Inpatient/ED Visit) (Signed)
   12/10/2023  Name: Bruce Barton MRN: [id number] DOB: 06/07/2001  Today's TOC FU Call Status: Today's TOC FU Call Status:: Unsuccessful Call (1st Attempt) Unsuccessful Call (1st Attempt) Date: 12/10/23  Attempted to reach the patient regarding the most recent Inpatient/ED visit.  Follow Up Plan: Additional outreach attempts will be made to reach the patient to complete the Transitions of Care (Post Inpatient/ED visit) call.   Signature  Jenny Reichmann

## 2024-01-18 ENCOUNTER — Encounter: Payer: Self-pay | Admitting: Family Medicine

## 2024-01-18 ENCOUNTER — Ambulatory Visit (INDEPENDENT_AMBULATORY_CARE_PROVIDER_SITE_OTHER): Admitting: Family Medicine

## 2024-01-18 VITALS — BP 128/86 | HR 105 | Temp 98.8°F | Wt 231.0 lb

## 2024-01-18 DIAGNOSIS — J019 Acute sinusitis, unspecified: Secondary | ICD-10-CM | POA: Diagnosis not present

## 2024-01-18 DIAGNOSIS — R059 Cough, unspecified: Secondary | ICD-10-CM

## 2024-01-18 LAB — POC COVID19 BINAXNOW: SARS Coronavirus 2 Ag: NEGATIVE

## 2024-01-18 LAB — POCT INFLUENZA A/B
Influenza A, POC: NEGATIVE
Influenza B, POC: NEGATIVE

## 2024-01-18 MED ORDER — IBUPROFEN 800 MG PO TABS: 800.0000 mg | ORAL_TABLET | Freq: Three times a day (TID) | ORAL | 0 refills | Status: AC | PRN

## 2024-01-18 MED ORDER — FLUTICASONE PROPIONATE 50 MCG/ACT NA SUSP: 2.0000 | Freq: Every day | NASAL | 6 refills | Status: AC

## 2024-01-18 NOTE — Progress Notes (Signed)
 Assessment/Plan:   Assessment & Plan Viral sinusitis Acute viral sinusitis with symptoms of cough, green and bloody nasal discharge, facial pain around the eyes, and chills. Symptoms have persisted for two days and are worsening. A fever of 100F was reported at home, but no fever was recorded during the visit. Negative COVID-19 and influenza tests. Differential diagnosis includes bacterial sinusitis, but presentation is consistent with viral etiology. Management includes prescription strength ibuprofen  and nasal steroids to alleviate symptoms and prevent progression to bacterial sinusitis. - Prescribe ibuprofen  800 mg three times daily as needed for pain or fever. - Prescribe nasal steroid to facilitate nasal drainage. - Recommend nasal saline rinses as needed. - Advise monitoring symptoms and return if symptoms persist beyond 10 days, worsen after initial improvement, or if fever exceeds 102F for several days. - Advise rest, increased fluid intake, and provide work excuse for Wednesday, Thursday, and Friday.      There are no discontinued medications.  Return if symptoms worsen or fail to improve.    Subjective:   Encounter date: 01/18/2024  Bruce Barton is a 23 y.o. male who has ADHD (attention deficit hyperactivity disorder); Anxiety with depression; and Acute non-recurrent sinusitis on their problem list..   He  has a past medical history of ADHD (attention deficit hyperactivity disorder), combined type (01/14/2016) and Dysgraphia (01/14/2016).Bruce Barton   He presents with chief complaint of Sinus Problem (Sinus pressure, cough, chills x 1 day) .     History of Present Illness Bruce Barton is a 23 year old male who presents with symptoms suggestive of a sinus infection.  He has been experiencing symptoms since Sunday evening, with worsening by Tuesday. Symptoms include coughing up green and bloody mucus, significant nasal pain, and epistaxis. Facial pain is present around both  eyes.  He has been using Nyquil to aid sleep and takes ibuprofen  and Claritin during the day. He has not used any nasal sprays. He experienced chills and a fever of 100F after being sent home from work due to his symptoms. He feels cold despite using heat in the car and taking a hot shower.  He reports wheezing at times and chest pain associated with frequent coughing. No shortness of breath.  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       Past Surgical History:  Procedure Laterality Date   I & D EXTREMITY Left 06/10/2022   Procedure: IRRIGATION AND DEBRIDEMENT EXTREMITY;  Surgeon: Wes Hamman, MD;  [facility]:  [facility];  Service: Orthopedics;  Laterality: Left;   WISDOM TOOTH EXTRACTION  04/2021    Outpatient Medications Prior to Visit  Medication Sig Dispense Refill   atomoxetine  (STRATTERA ) 80 MG capsule Take 1 capsule (80 mg total) by mouth daily. (Patient not taking: Reported on 01/18/2024) 90 capsule 3   loperamide  (IMODIUM  A-D) 2 MG tablet Take 1 tablet (2 mg total) by mouth 4 (four) times daily as needed for diarrhea or loose stools. (Patient not taking: Reported on 01/18/2024) 30 tablet 0   ondansetron  (ZOFRAN -ODT) 4 MG disintegrating tablet Take 1 tablet (4 mg total) by mouth every 8 (eight) hours as needed for nausea or vomiting. (Patient not taking: Reported on 01/18/2024) 20 tablet 0   oseltamivir  (TAMIFLU ) 75 MG capsule Take 1 capsule (75 mg total) by mouth 2 (two) times daily. (Patient not taking: Reported on 01/18/2024) 10 capsule 0  promethazine -dextromethorphan (PROMETHAZINE -DM) 6.25-15 MG/5ML syrup Take 5 mLs by mouth 4 (four) times daily as needed for cough. (Patient not taking: Reported on 01/18/2024) 180 mL 0   No facility-administered medications prior to visit.    Family History  Problem Relation Age of Onset   Hypertension Mother    Diabetes  Paternal Grandmother    Heart disease Paternal Grandfather     Social History   Socioeconomic History   Marital status: Single    Spouse name: Not on file   Number of children: 0   Years of education: Not on file   Highest education level: 12th grade  Occupational History   Occupation: Landscaping/Maintenance    Employer: [employer]  Tobacco Use   Smoking status: Every Day    Current packs/day: 0.00    Types: E-cigarettes, Cigarettes    Start date: 05/29/2016    Last attempt to quit: 2024    Years since quitting: 1.3   Smokeless tobacco: Former    Quit date: 09/18/2016   Tobacco comments:    disposable - one per two week  Vaping Use   Vaping status: Every Day   Substances: Nicotine   Devices: [employer]  Substance and Sexual Activity   Alcohol use: Yes    Alcohol/week: 1.0 standard drink of alcohol    Types: 1 Cans of beer per week    Comment: socially   Drug use: Not Currently    Types: Marijuana   Sexual activity: Yes    Partners: Female    Birth control/protection: Condom  Other Topics Concern   Not on file  Social History Narrative   Not on file   Social Drivers of Health   Financial Resource Strain: Not on file  Food Insecurity: Not on file  Transportation Needs: Not on file  Physical Activity: Not on file  Stress: Not on file  Social Connections: Not on file  Intimate Partner Violence: Not on file                                                                                                  Objective:  Physical Exam: BP 128/86   Pulse (!) 105   Temp 98.8 F (37.1 C) (Temporal)   Wt 231 lb (104.8 kg)   SpO2 98%   BMI 34.11 kg/m     Physical Exam Constitutional:      Appearance: Normal appearance.  HENT:     Head: Normocephalic and atraumatic.     Right Ear: Hearing normal.     Left Ear: Hearing normal.     Nose: Nose normal.  Eyes:     General: No scleral icterus.       Right eye: No discharge.        Left eye: No  discharge.     Extraocular Movements: Extraocular movements intact.  Cardiovascular:     Rate and Rhythm: Normal rate and regular rhythm.     Heart sounds: Normal heart sounds.  Pulmonary:     Effort: Pulmonary effort is normal.     Breath sounds: Normal breath sounds.  Abdominal:     Palpations: Abdomen is soft.     Tenderness: There is no abdominal tenderness.  Skin:    General: Skin is warm.     Findings: No rash.  Neurological:     General: No focal deficit present.     Mental Status: He is alert.     Cranial Nerves: No cranial nerve deficit.  Psychiatric:        Mood and Affect: Mood normal.        Behavior: Behavior normal.        Thought Content: Thought content normal.        Judgment: Judgment normal.     No results found.  Recent Results (from the past 2160 hours)  Urinalysis, Routine w reflex microscopic -Urine, Clean Catch     Status: Abnormal   Collection Time: 12/09/23  4:59 PM  Result Value Ref Range   Color, Urine YELLOW YELLOW   APPearance CLEAR CLEAR   Specific Gravity, Urine >=1.030 1.005 - 1.030   pH 6.0 5.0 - 8.0   Glucose, UA NEGATIVE NEGATIVE mg/dL   Hgb urine dipstick NEGATIVE NEGATIVE   Bilirubin Urine NEGATIVE NEGATIVE   Ketones, ur NEGATIVE NEGATIVE mg/dL   Protein, ur 161 (A) NEGATIVE mg/dL   Nitrite NEGATIVE NEGATIVE   Leukocytes,Ua NEGATIVE NEGATIVE    Comment: Performed at [facility], [address] [place], [place] [address]  Urinalysis, Microscopic (reflex)     Status: Abnormal   Collection Time: 12/09/23  4:59 PM  Result Value Ref Range   RBC / HPF NONE SEEN 0 - 5 RBC/hpf   WBC, UA NONE SEEN 0 - 5 WBC/hpf   Bacteria, UA RARE (A) NONE SEEN   Squamous Epithelial / HPF 0-5 0 - 5 /HPF    Comment: Performed at [facility], [address] [place], [place] [address]  Lipase, blood     Status: None   Collection Time: 12/09/23  5:00 PM  Result Value Ref Range   Lipase 22 11 - 51 U/L    Comment: Performed at  [facility], [address] [place], [place] [address]  Comprehensive metabolic panel     Status: Abnormal   Collection Time: 12/09/23  5:00 PM  Result Value Ref Range   Sodium 135 135 - 145 mmol/L   Potassium 3.5 3.5 - 5.1 mmol/L   Chloride 102 98 - 111 mmol/L   CO2 19 (L) 22 - 32 mmol/L   Glucose, Bld 104 (H) 70 - 99 mg/dL    Comment: Glucose reference range applies only to samples taken after fasting for at least 8 hours.   BUN 19 6 - 20 mg/dL   Creatinine, Ser 7.82 0.61 - 1.24 mg/dL   Calcium 8.8 (L) 8.9 - 10.3 mg/dL   Total Protein 8.1 6.5 - 8.1 g/dL   Albumin 4.0 3.5 - 5.0 g/dL   AST 25 15 - 41 U/L   ALT 32 0 - 44 U/L   Alkaline Phosphatase 87 38 - 126 U/L   Total Bilirubin 0.8 0.0 - 1.2 mg/dL   GFR, Estimated >95 >62 mL/min    Comment: (NOTE) Calculated using the CKD-EPI Creatinine Equation (2021)    Anion gap 14 5 - 15    Comment: Performed at [facility], [address] [place], [place] [address]  CBC     Status: Abnormal   Collection Time: 12/09/23  5:00 PM  Result  Value Ref Range   WBC 11.1 (H) 4.0 - 10.5 K/uL   RBC 4.91 4.22 - 5.81 MIL/uL   Hemoglobin 14.8 13.0 - 17.0 g/dL   HCT 16.1 09.6 - 04.5 %   MCV 86.4 80.0 - 100.0 fL   MCH 30.1 26.0 - 34.0 pg   MCHC 34.9 30.0 - 36.0 g/dL   RDW 40.9 81.1 - 91.4 %   Platelets 292 150 - 400 K/uL   nRBC 0.0 0.0 - 0.2 %    Comment: Performed at [facility], [address] [place], [place] [address]  Resp panel by RT-PCR (RSV, Flu A&B, Covid) Anterior Nasal Swab     Status: None   Collection Time: 12/09/23  5:03 PM   Specimen: Anterior Nasal Swab  Result Value Ref Range   SARS Coronavirus 2 by RT PCR NEGATIVE NEGATIVE    Comment: (NOTE) SARS-CoV-2 target nucleic acids are NOT DETECTED.  The SARS-CoV-2 RNA is generally detectable in upper respiratory specimens during the acute phase of infection. The lowest concentration of SARS-CoV-2 viral copies this assay can detect is 138  copies/mL. A negative result does not preclude SARS-Cov-2 infection and should not be used as the sole basis for treatment or other patient management decisions. A negative result may occur with  improper specimen collection/handling, submission of specimen other than nasopharyngeal swab, presence of viral mutation(s) within the areas targeted by this assay, and inadequate number of viral copies(<138 copies/mL). A negative result must be combined with clinical observations, patient history, and epidemiological information. The expected result is Negative.  Fact Sheet for Patients:  BloggerCourse.com  Fact Sheet for Healthcare Providers:  SeriousBroker.it  This test is no t yet approved or cleared by the [place]  FDA and  has been authorized for detection and/or diagnosis of SARS-CoV-2 by FDA under an Emergency Use Authorization (EUA). This EUA will remain  in effect (meaning this test can be used) for the duration of the COVID-19 declaration under Section 564(b)(1) of the Act, 21 U.S.C.section 360bbb-3(b)(1), unless the authorization is terminated  or revoked sooner.       Influenza A by PCR NEGATIVE NEGATIVE   Influenza B by PCR NEGATIVE NEGATIVE    Comment: (NOTE) The Xpert Xpress SARS-CoV-2/FLU/RSV plus assay is intended as an aid in the diagnosis of influenza from Nasopharyngeal swab specimens and should not be used as a sole basis for treatment. Nasal washings and aspirates are unacceptable for Xpert Xpress SARS-CoV-2/FLU/RSV testing.  Fact Sheet for Patients: BloggerCourse.com  Fact Sheet for Healthcare Providers: SeriousBroker.it  This test is not yet approved or cleared by the [place]  FDA and has been authorized for detection and/or diagnosis of SARS-CoV-2 by FDA under an Emergency Use Authorization (EUA). This EUA will remain in effect (meaning this test  can be used) for the duration of the COVID-19 declaration under Section 564(b)(1) of the Act, 21 U.S.C. section 360bbb-3(b)(1), unless the authorization is terminated or revoked.     Resp Syncytial Virus by PCR NEGATIVE NEGATIVE    Comment: (NOTE) Fact Sheet for Patients: BloggerCourse.com  Fact Sheet for Healthcare Providers: SeriousBroker.it  This test is not yet approved or cleared by the [place]  FDA and has been authorized for detection and/or diagnosis of SARS-CoV-2 by FDA under an Emergency Use Authorization (EUA). This EUA will remain in effect (meaning this test can be used) for the duration of the COVID-19 declaration under Section 564(b)(1) of the Act, 21 U.S.C. section 360bbb-3(b)(1), unless the authorization is  terminated or revoked.  Performed at [facility], [address], [place], [place] [address]         Carnell Christian, MD, MS

## 2024-01-18 NOTE — Patient Instructions (Addendum)
 VISIT SUMMARY:  You came in today with symptoms of a sinus infection, including coughing up green and bloody mucus, nasal pain, facial pain, chills, and a fever. You have been using Nyquil, ibuprofen , and Claritin to manage your symptoms. 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  YOUR PLAN:  -VIRAL SINUSITIS: Viral sinusitis is an infection of the sinuses caused by a virus, leading to symptoms like cough, green and bloody nasal discharge, facial pain, and chills. To manage your symptoms, you have been prescribed ibuprofen  800 mg to take three times daily as needed for pain or fever, and a nasal steroid to help with nasal drainage. You should also use nasal saline rinses as needed. Monitor your symptoms and return if they persist beyond 10 days, worsen after initial improvement, or if your fever exceeds 102F for several days. Rest, increase your fluid intake, and you have been provided a work excuse for Wednesday, Thursday, and Friday.  INSTRUCTIONS:  Please follow the prescribed medication regimen and monitor your symptoms closely. Return to the clinic if your symptoms persist beyond 10 days, worsen after initial improvement, or if your fever exceeds 102F for several days. Rest and increase your fluid intake. You have been provided a work excuse for Wednesday, Thursday, and Friday.  Please be sure to drink plenty of fluids.   You may take the following OTC medications to help with symptoms:  For cough, use  cough syrups or other cough suppressants.  For headache, sore throat, fevers, muscle aches, chills, other pain, take ibuprofen  or tylenol   For congestion, use nasal sprays, decongestants, or antihistamines  Please follow up if no improvement.   Go to ED if you have severe chest pain, fevers, shortness of breath or other worrisome symptoms.
